# Patient Record
Sex: Male | Born: 1945 | Race: White | Hispanic: No | Marital: Single | Smoking: Never smoker
Health system: Southern US, Academic
[De-identification: ages and names within clinical notes are randomized; demographics above are authoritative.]

## PROBLEM LIST (undated history)

## (undated) DIAGNOSIS — D509 Iron deficiency anemia, unspecified: Secondary | ICD-10-CM

## (undated) DIAGNOSIS — E559 Vitamin D deficiency, unspecified: Secondary | ICD-10-CM

## (undated) DIAGNOSIS — I10 Essential (primary) hypertension: Secondary | ICD-10-CM

## (undated) DIAGNOSIS — N400 Enlarged prostate without lower urinary tract symptoms: Secondary | ICD-10-CM

## (undated) DIAGNOSIS — E782 Mixed hyperlipidemia: Secondary | ICD-10-CM

## (undated) DIAGNOSIS — E039 Hypothyroidism, unspecified: Secondary | ICD-10-CM

## (undated) DIAGNOSIS — M109 Gout, unspecified: Secondary | ICD-10-CM

## (undated) DIAGNOSIS — E538 Deficiency of other specified B group vitamins: Secondary | ICD-10-CM

## (undated) HISTORY — PX: TONSILLECTOMY: SUR1361

---

## 1999-08-12 ENCOUNTER — Other Ambulatory Visit (HOSPITAL_COMMUNITY): Payer: Self-pay

## 2013-08-21 IMAGING — CR XRAY CERVICAL SPINE COMPLETE
1 series · 7 of 7 positions shown · non-contrast
Comparison: None.

Exam:   

Cervical spine 4V
INDICATION: Neck pain.

[Series 1: view not recorded · oblique · 0.17mm/px · 7 of 7 slices shown]
[im 1/7]
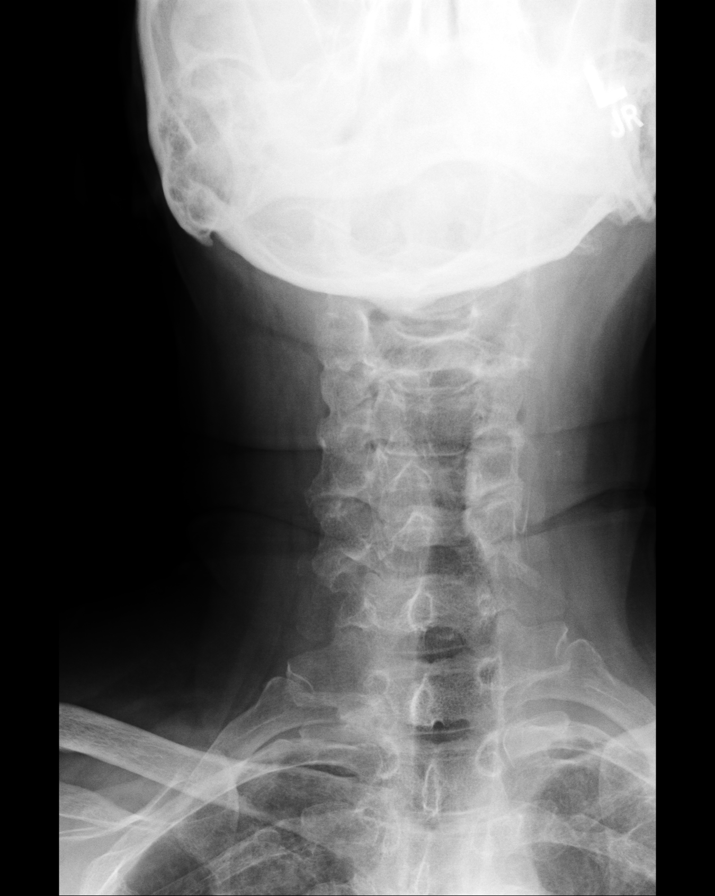
[im 2/7]
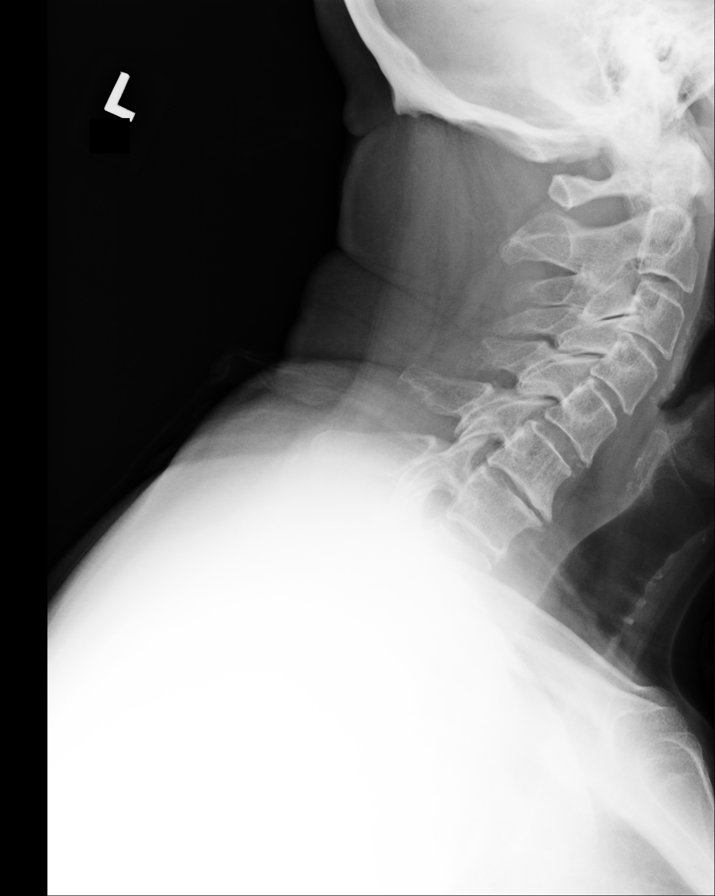
[im 3/7]
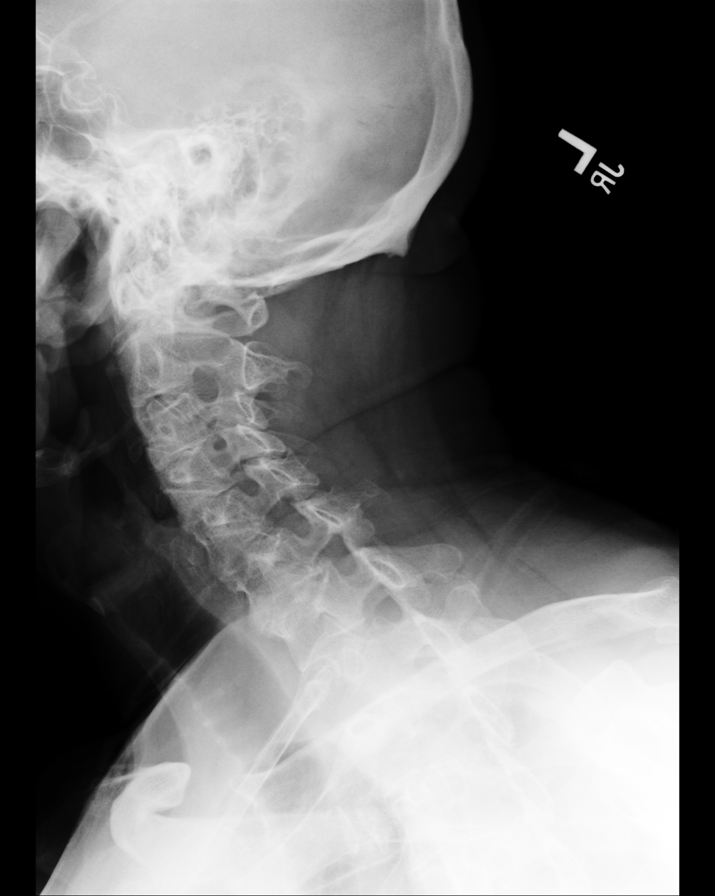
[im 4/7]
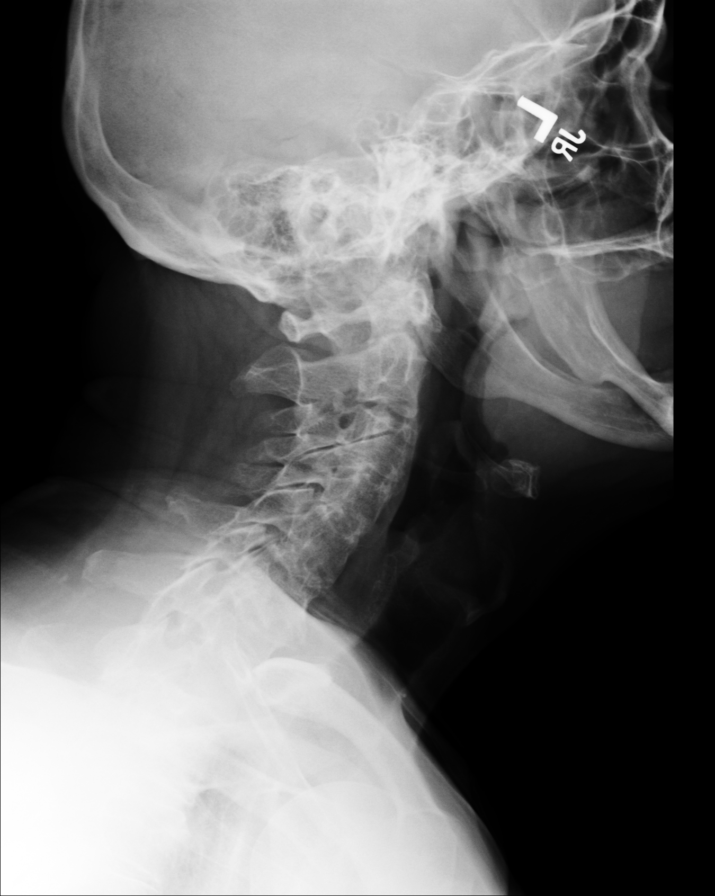
[im 5/7]
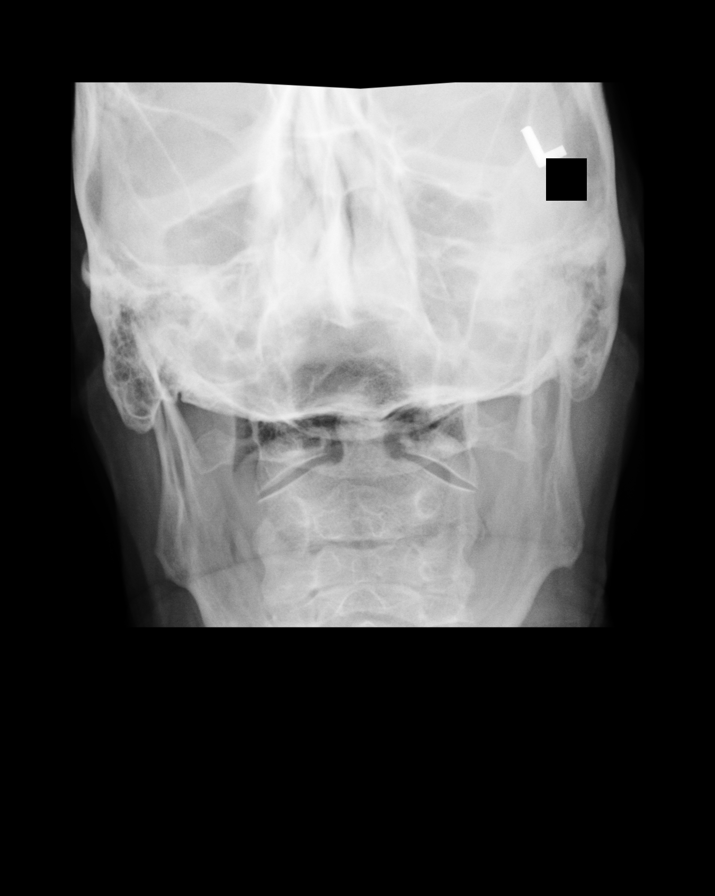
[im 6/7]
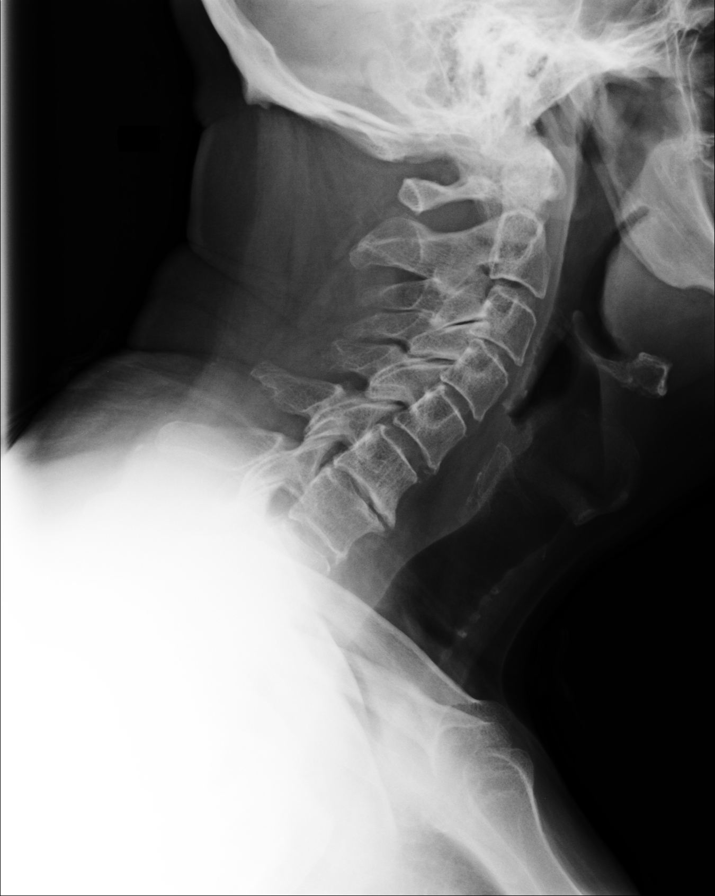
[im 7/7]
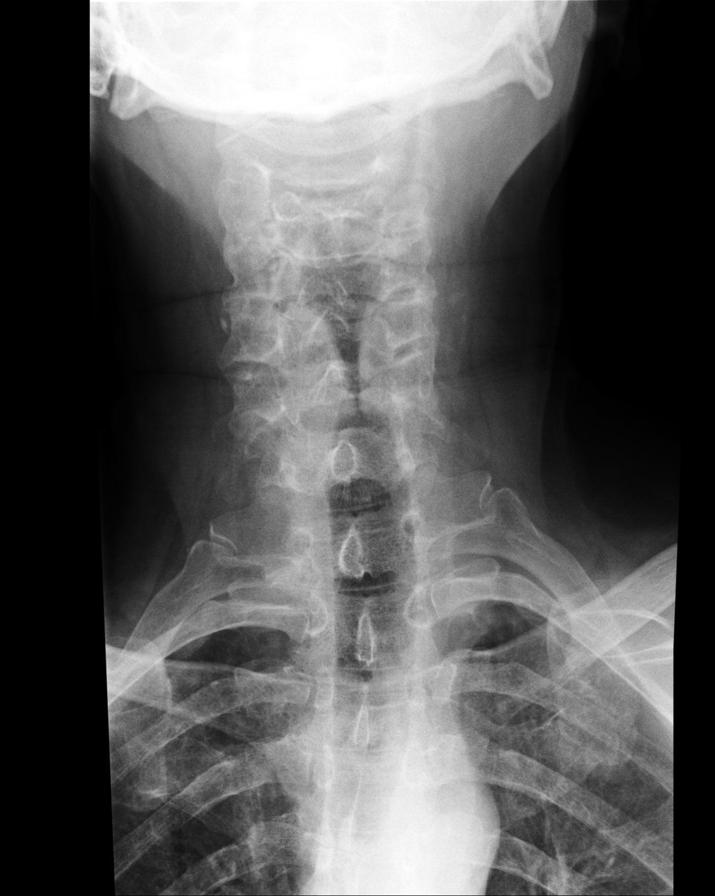

[7 of 7 positions shown; findings below may reference images not displayed]

FINDINGS: There is no evidence of an acute fracture or subluxation. Moderate to severe disc space narrowing is noted at C6-7 level. There is no prevertebral soft tissue swelling. Atlanto-axial articulation appears well maintained. There are minimal vascular calcifications.
IMPRESSION: 1.
 Arthritic changes as detailed above. Please consider further evaluation with MRI for persistent or worsening symptoms.

## 2016-06-08 IMAGING — CR XRAY KNEE [DATE] VIEWS RT
1 series · 3 of 3 positions shown · non-contrast
Comparison: None.

Exam:   

Right knee 3V
INDICATION: Pain.

[Series 1: view not recorded · 0.17mm/px · 3 of 3 slices shown]
[im 1/3]
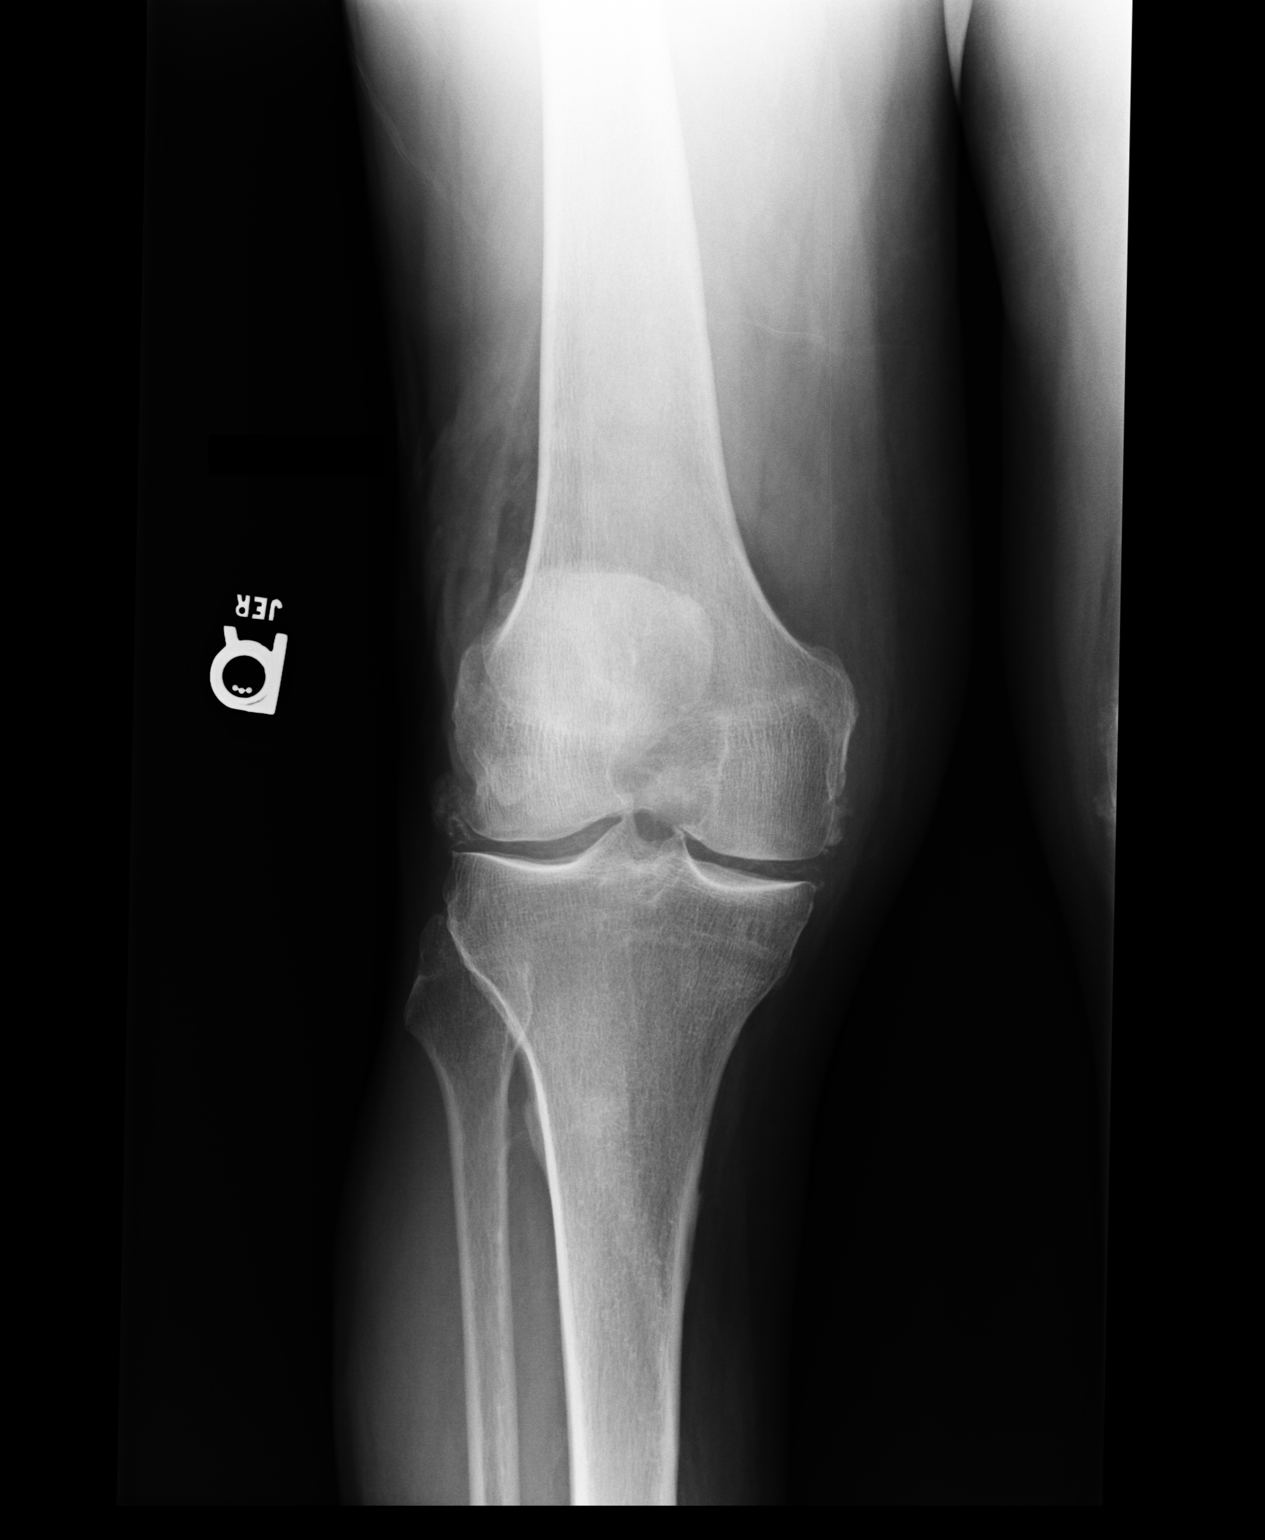
[im 2/3]
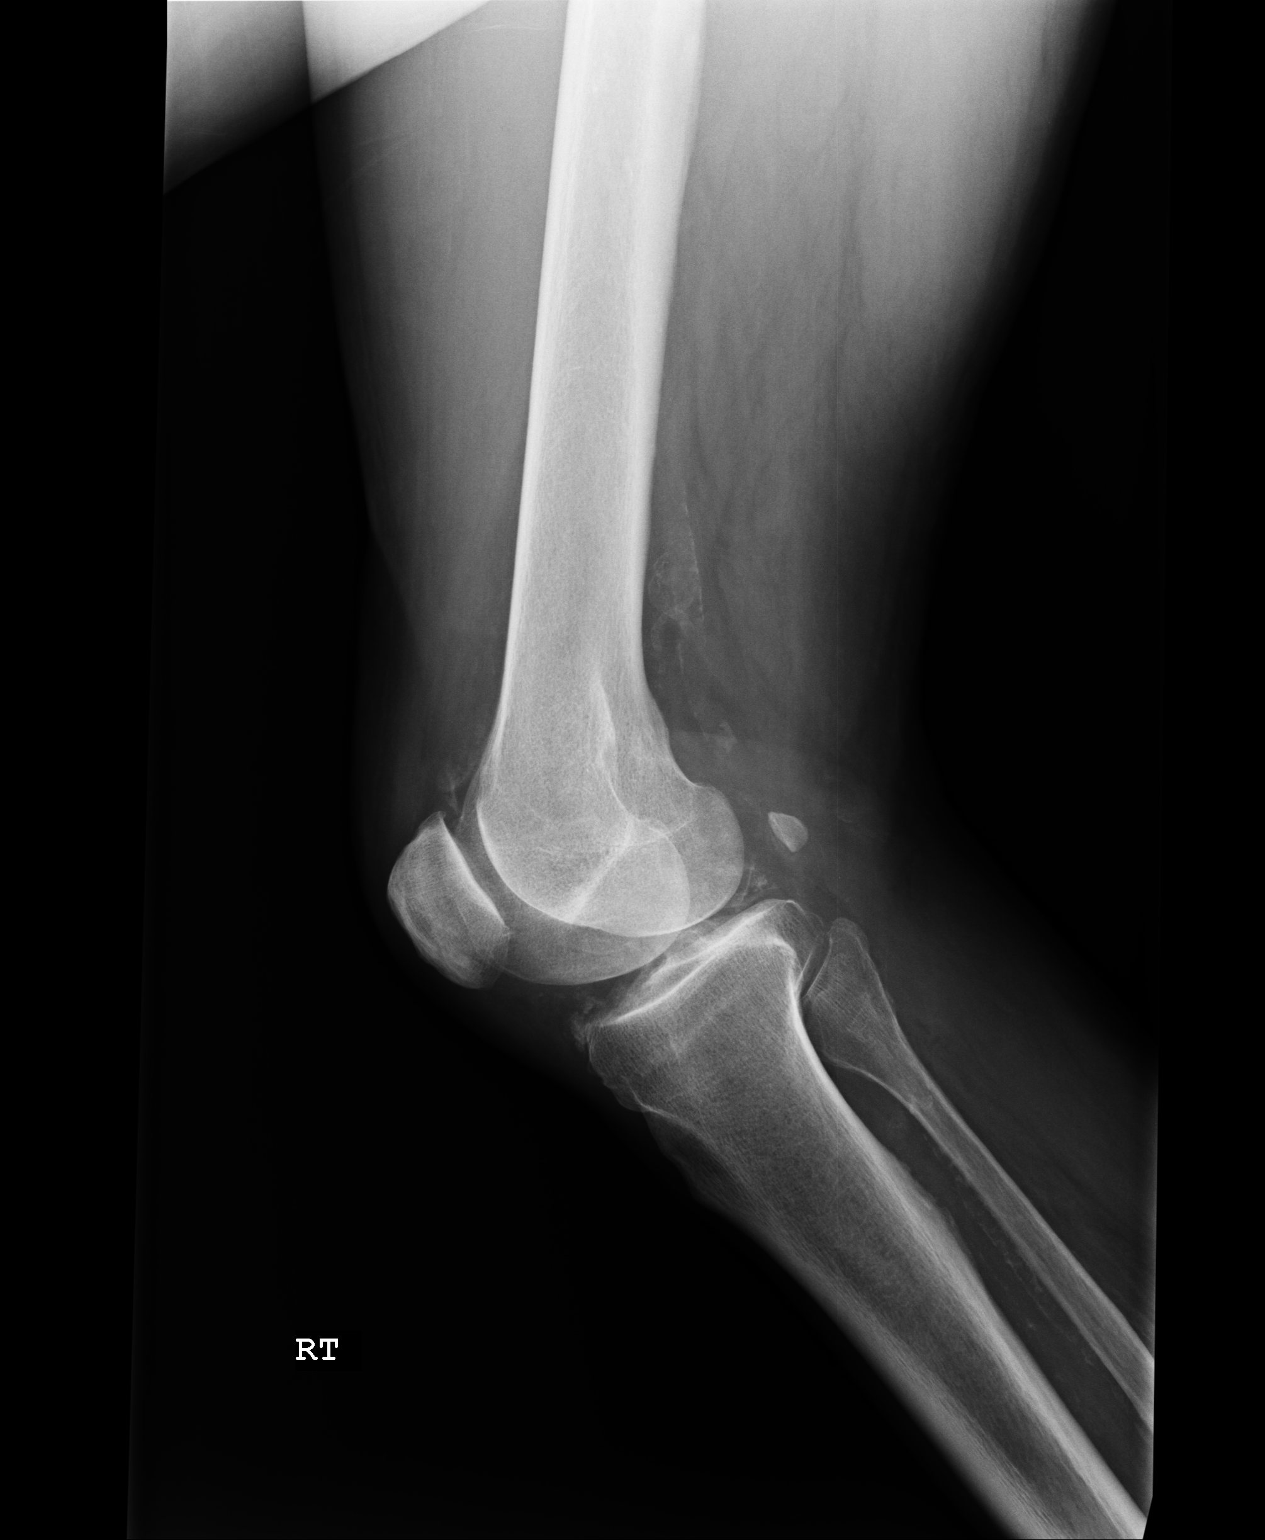
[im 3/3]
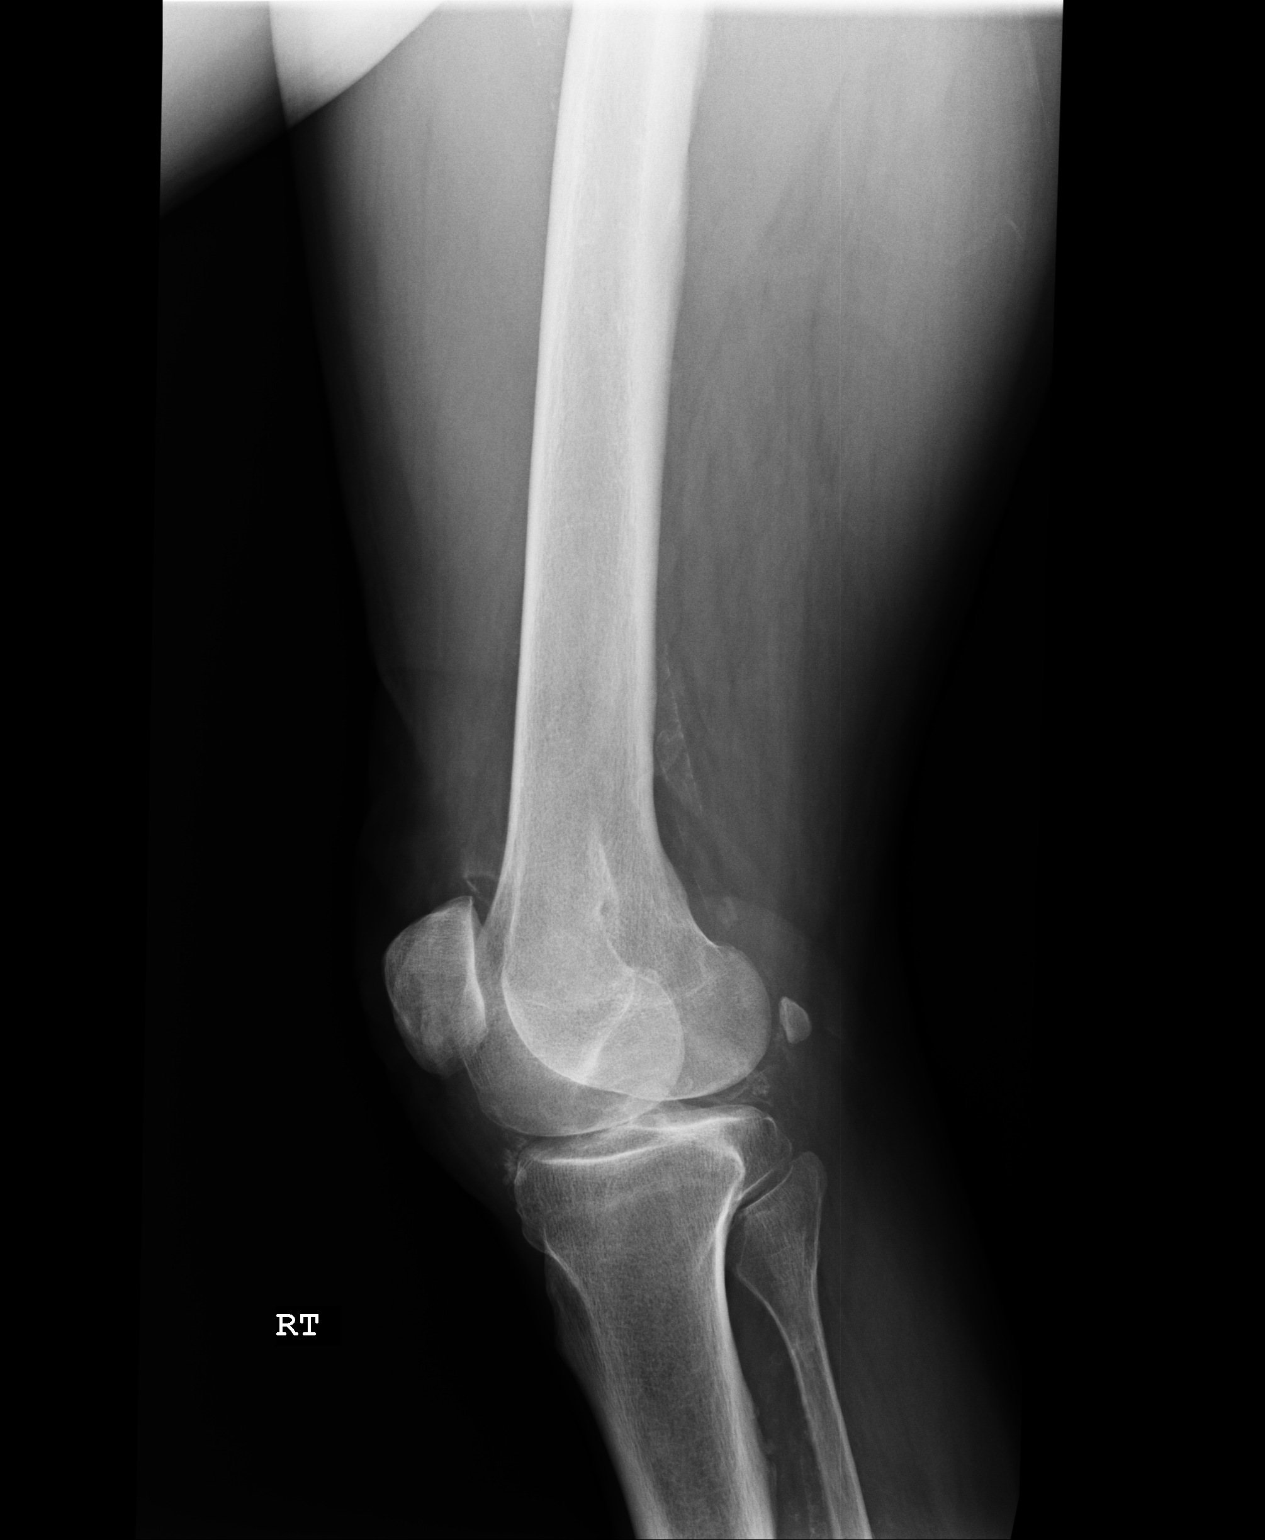

[3 of 3 positions shown; findings below may reference images not displayed]

FINDINGS: There is no acute fracture or subluxation. Mild tricompartmental joint space narrowing is seen. Meniscal calcifications are consistent with chondrocalcinosis. There are extensive vascular calcifications. There is no suprapatellar effusion.
IMPRESSION: Mild tricompartmental joint space narrowing with chondrocalcinosis. Please consider further evaluation with MRI for persistent or worsening symptoms.

## 2022-07-13 ENCOUNTER — Other Ambulatory Visit: Payer: Medicare Other

## 2022-07-13 ENCOUNTER — Other Ambulatory Visit: Payer: Self-pay

## 2022-07-13 DIAGNOSIS — G609 Hereditary and idiopathic neuropathy, unspecified: Secondary | ICD-10-CM | POA: Insufficient documentation

## 2022-07-13 DIAGNOSIS — R739 Hyperglycemia, unspecified: Secondary | ICD-10-CM | POA: Insufficient documentation

## 2022-07-13 LAB — VITAMIN B12: VITAMIN B 12: 608 pg/mL (ref 180–914)

## 2022-07-13 LAB — C-REACTIVE PROTEIN (CRP): C-REACTIVE PROTEIN (CRP): 3.1 mg/dL — ABNORMAL HIGH (ref 0.1–0.5)

## 2022-07-13 LAB — HGA1C (HEMOGLOBIN A1C WITH EST AVG GLUCOSE): HEMOGLOBIN A1C: 5.8 % (ref 4.0–6.0)

## 2022-07-13 LAB — FOLATE: FOLATE: >23.8 ng/mL (ref 5.9–24.4)

## 2022-07-15 LAB — MONOCLONAL GAMMOPATHY PROFILE WITH IMMUNOTYPING REFLEX
ALBUMIN: 3.9 g/dL (ref 3.4–4.8)
KAPPA FREE LIGHT CHAINS: 3.82 mg/dL — ABNORMAL HIGH (ref 0.33–1.94)
KAPPA/LAMBDA FLC RATIO: 1.85 — ABNORMAL HIGH (ref 0.26–1.65)
LAMBDA FREE LIGHT CHAINS: 2.07 mg/dL (ref 0.57–2.63)
TOTAL PROTEIN: 7.2 g/dL (ref 5.6–7.6)

## 2022-07-15 LAB — HEP-2 SUBSTRATE ANTINUCLEAR ANTIBODIES (ANA), SERUM: ANA INTERPRETATION: NEGATIVE

## 2022-07-15 LAB — LYME DISEASE ANTIBODY, IGM: LYME ANTIBODY IGM (Confirmation): NEGATIVE

## 2022-07-15 LAB — KAPPA AND LAMBDA FREE LIGHT CHAINS, SERUM
KAPPA FREE LIGHT CHAINS: 3.82 mg/dL — ABNORMAL HIGH (ref 0.33–1.94)
KAPPA/LAMBDA FLC RATIO: 1.85 — ABNORMAL HIGH (ref 0.26–1.65)
LAMBDA FREE LIGHT CHAINS: 2.07 mg/dL (ref 0.57–2.63)

## 2022-07-15 LAB — LYME DISEASE ANTIBODY, IGG: LYME ANTIBODY IGG (Confirmation): POSITIVE — AB

## 2022-07-15 LAB — LYME ANTIBODY PANEL WITH REFLEX: LYME ANTIBODY TOTAL (Screen): POSITIVE — AB

## 2022-07-15 LAB — SYPHILIS SCREENING ALGORITHM WITH REFLEX, SERUM: SYPHILIS TP ANTIBODIES: NONREACTIVE

## 2022-07-15 LAB — ALBUMIN FOR ELECTROPHORESIS: ALBUMIN: 3.9 g/dL (ref 3.4–4.8)

## 2022-07-15 LAB — PROTEIN FOR ELECTROPHORESIS: PROTEIN TOTAL: 7.2 g/dL (ref 5.6–7.6)

## 2022-07-19 LAB — ANGIOTENSIN CONVERTING ENZYME (ACE), SERUM: ANGIOTENSIN CONV ENZYME: 5.4 U/L — ABNORMAL LOW (ref 9–67)

## 2022-07-20 LAB — CRYOGLOBULIN,SERUM

## 2022-07-28 ENCOUNTER — Other Ambulatory Visit (HOSPITAL_COMMUNITY): Payer: Self-pay

## 2022-07-28 DIAGNOSIS — G619 Inflammatory polyneuropathy, unspecified: Secondary | ICD-10-CM

## 2022-07-28 LAB — QUEST MISC ORDER - REFRIGERATED

## 2022-07-29 IMAGING — MR MRI THORACIC SPINE WITHOUT CONTRAST
4 of 7 series · 25 of 48 positions shown · IV contrast (gadolinium)
Comparison: None available.

﻿EXAM:  [id number]   MRI THORACIC SPINE WITHOUT CONTRAST
TECHNIQUE: Multiplanar, multisequential MRI of the thoracic spine was performed without gadolinium contrast.

[Series 5: T2 · sagittal · 4.0mm · 0.78mm/px · 8 of 13 slices shown (1 of 3)]
[im 1/13]
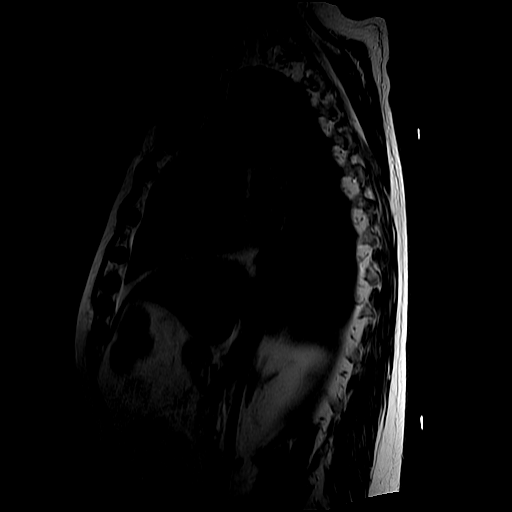
[im 2/13]
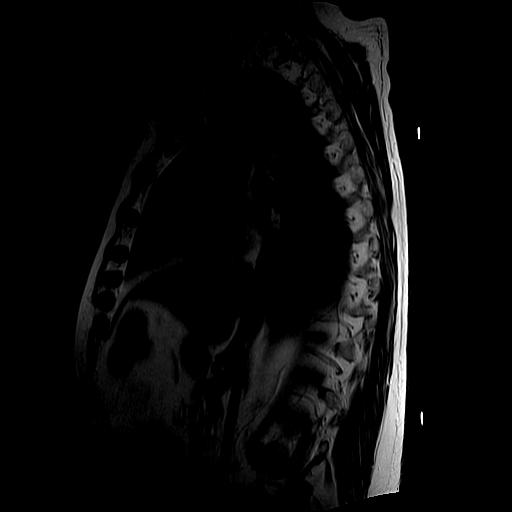
[im 5/13]
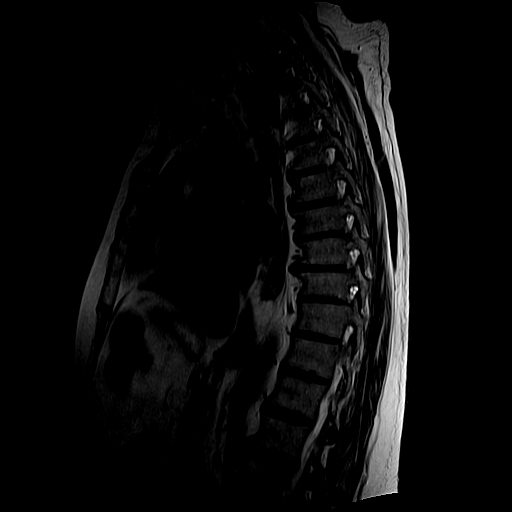
[im 6/13]
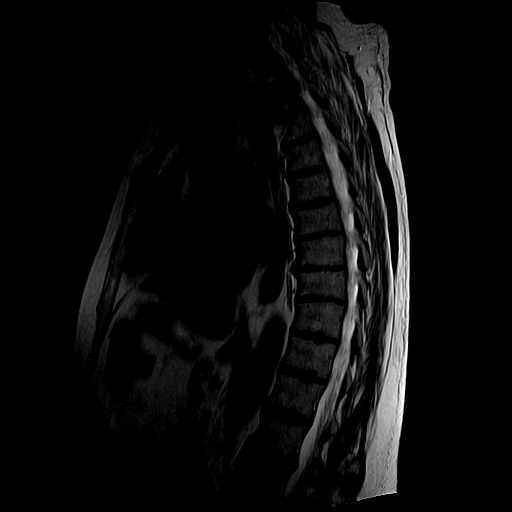
[im 7/13]
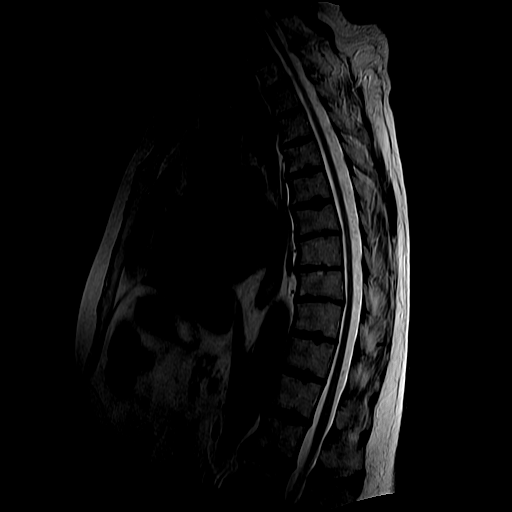
[im 9/13]
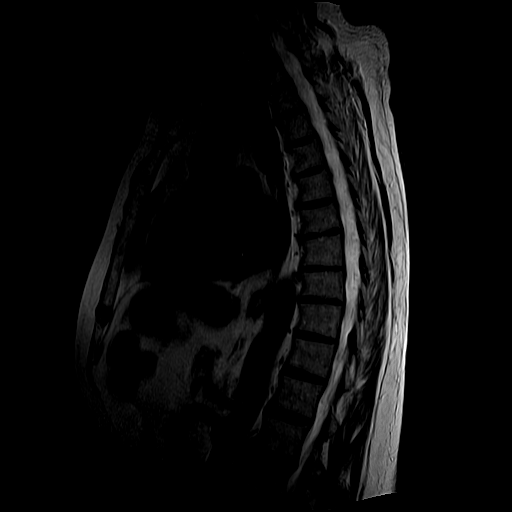
[im 11/13]
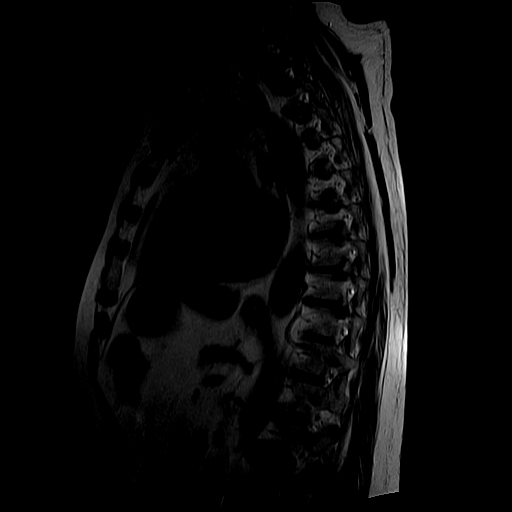
[im 13/13]
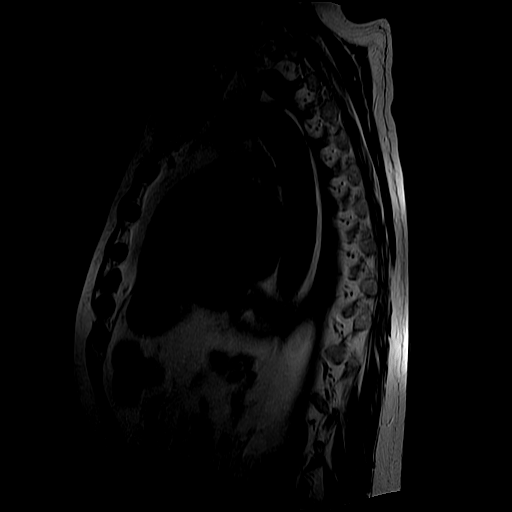

[Series 6: T1 · sagittal · 4.0mm · 0.78mm/px · 7 of 13 slices shown]
[im 1/13]
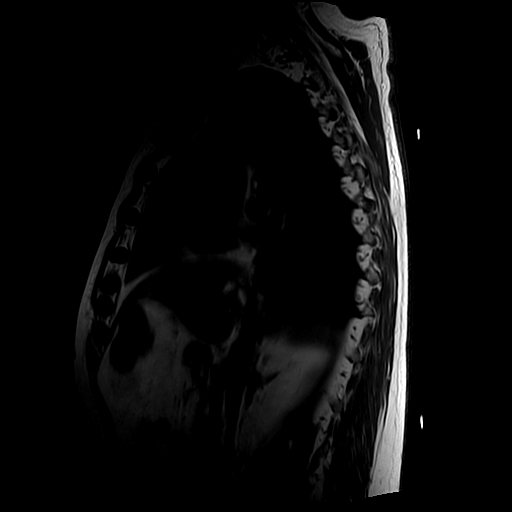
[im 2/13]
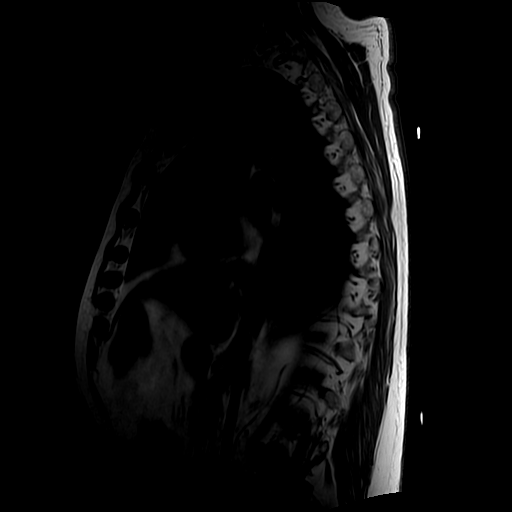
[im 4/13]
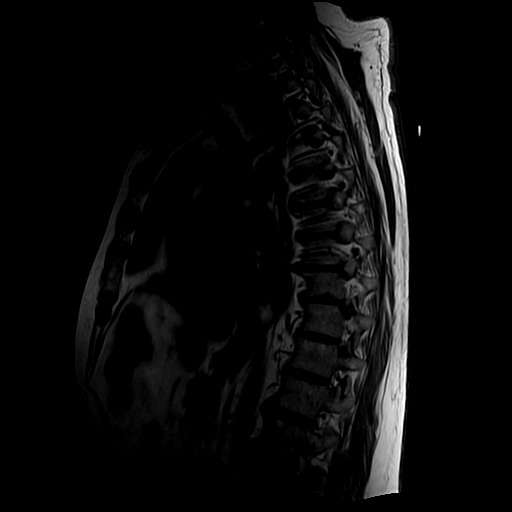
[im 5/13]
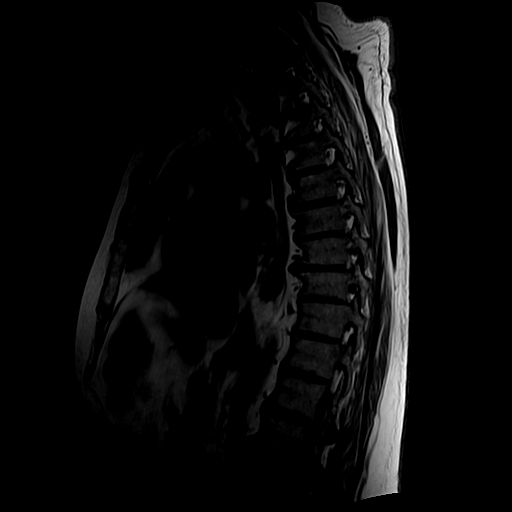
[im 7/13]
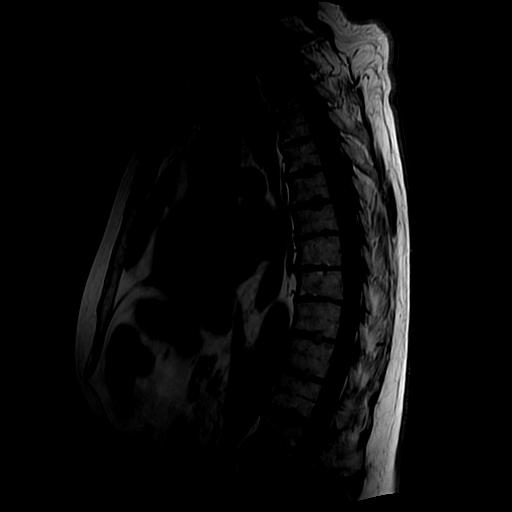
[im 8/13]
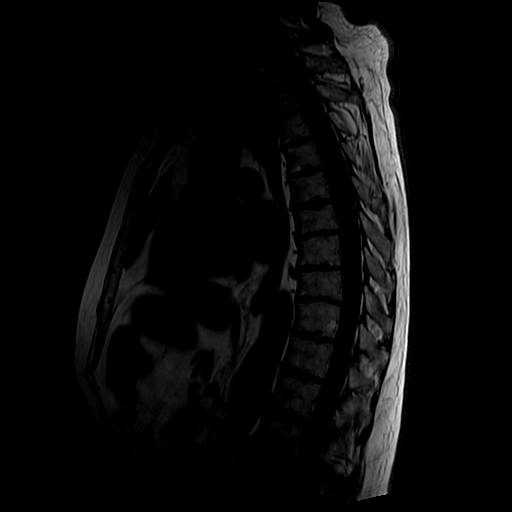
[im 11/13]
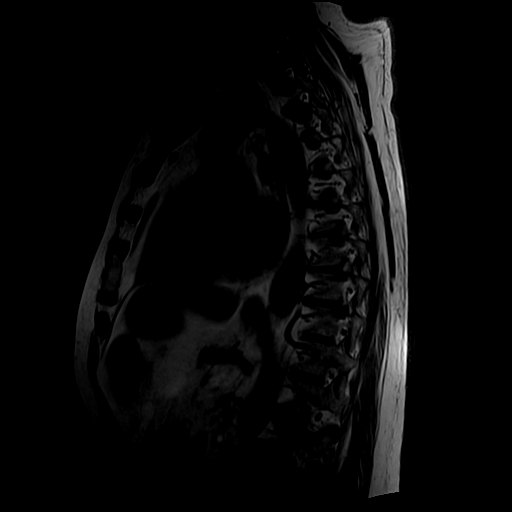

[Series 8: T2 · axial · 4.0mm · 0.62mm/px · z∈[-323,-90]mm · 9 of 12 slices shown (2 of 3)]
[im 1/12]
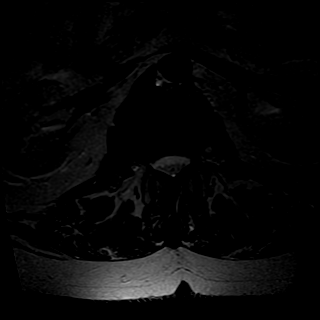
[im 2/12]
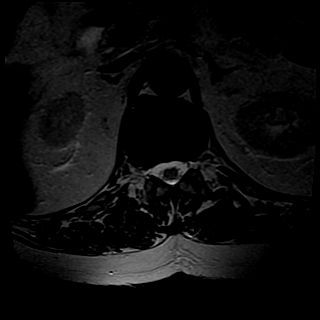
[im 3/12]
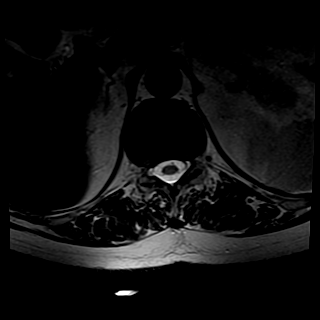
[im 5/12]
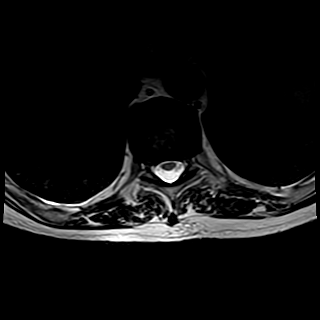
[im 6/12]
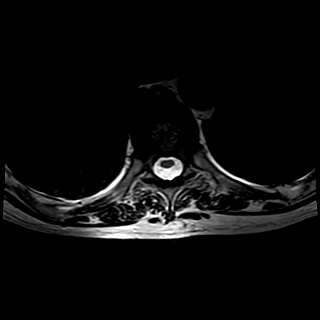
[im 7/12]
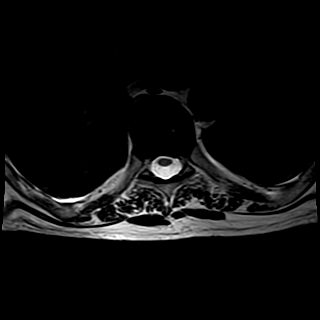
[im 9/12]
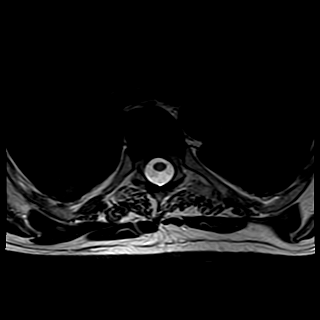
[im 10/12]
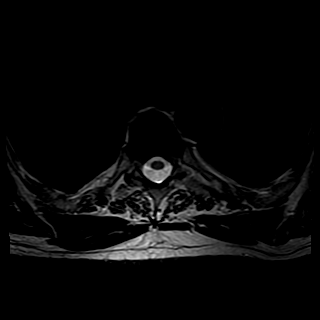
[im 12/12]
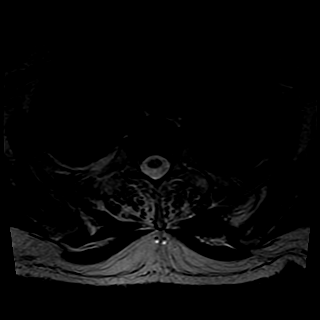

[Series 10: T2 · axial · 4.0mm · 0.62mm/px · 1 of 2 slices shown (3 of 3)]
[im 1/2]
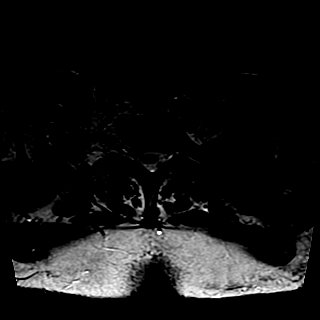

[25 of 48 positions shown; findings below may reference images not displayed]

FINDINGS: No acute bony lesions of the thoracic vertebrae are seen.  

Moderate degenerative disc changes are seen in the mid and lower thoracic spine at multiple levels with prominent anterior osteophyte formation in the lower thoracic region.  However, there is no evidence of central spinal stenosis or foraminal stenosis in the thoracic region.  

Thoracic spinal cord shows no focal lesions.  Paravertebral soft tissues are unremarkable.
IMPRESSION: 1. No focal bone changes of thoracic vertebrae.  

2. No evidence of central spinal stenosis or foraminal stenosis in the thoracic region.  Degenerative disc changes with prominent anterior osteophyte formation are noted in the mid and lower thoracic spine.  

3. No focal lesions of thoracic spinal cord are seen.

## 2022-07-29 IMAGING — MR MRI LUMBAR SPINE WITHOUT CONTRAST
5 of 6 series · 38 of 48 positions shown · IV contrast (gadolinium)
Comparison: None available.

﻿EXAM:  [id number]   MRI LUMBAR SPINE WITHOUT CONTRAST
INDICATION: 76-year-old with low back pain and bilateral lower extremity weakness.  No history of malignancy of back surgery.
TECHNIQUE: Multiplanar, multisequential MRI of the lumbosacral spine was performed without gadolinium contrast.

[Series 6: T2 · sagittal · 4.0mm · 0.94mm/px · 5 of 13 slices shown (1 of 3)]
[im 1/13]
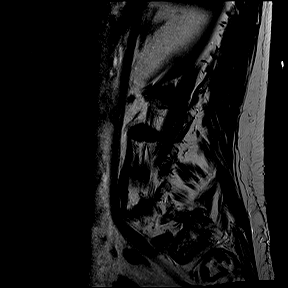
[im 4/13]
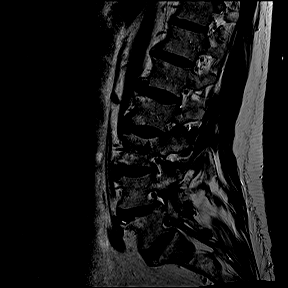
[im 7/13]
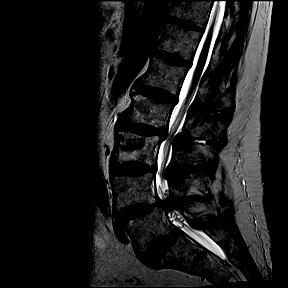
[im 10/13]
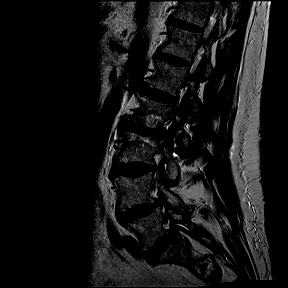
[im 13/13]
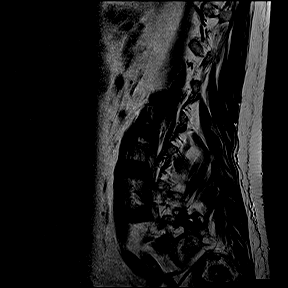

[Series 7: T1 · sagittal · 4.0mm · 0.94mm/px · 6 of 13 slices shown (1 of 2)]
[im 1/13]
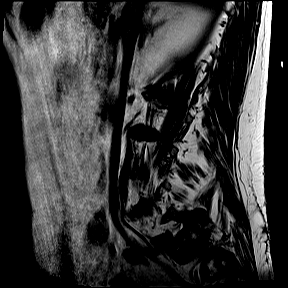
[im 3/13]
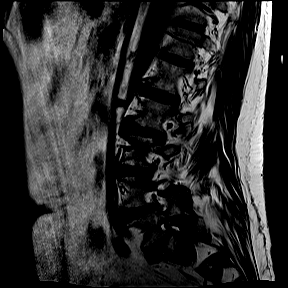
[im 5/13]
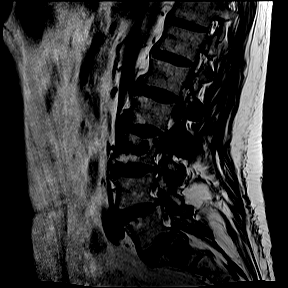
[im 8/13]
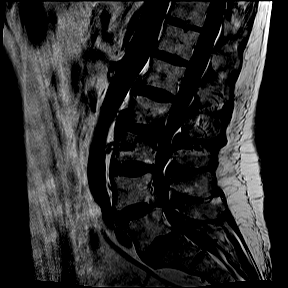
[im 10/13]
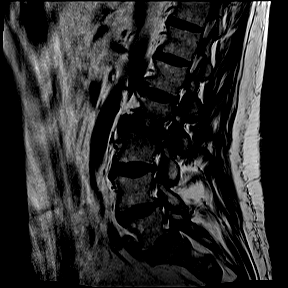
[im 13/13]
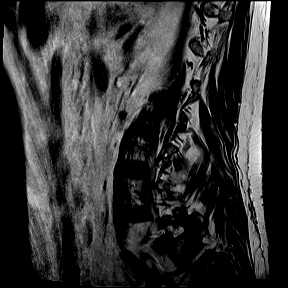

[Series 9: T2 · axial · 4.0mm · 0.47mm/px · z∈[-175,+41]mm · 11 of 23 slices shown (2 of 3)]
[im 1/23]
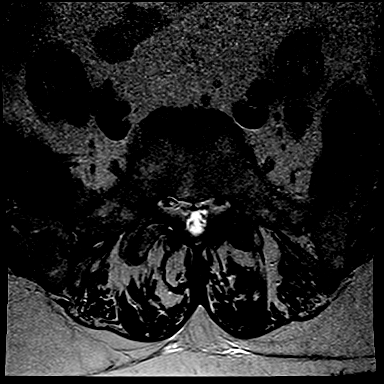
[im 3/23]
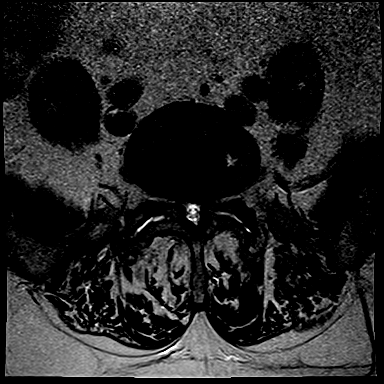
[im 5/23]
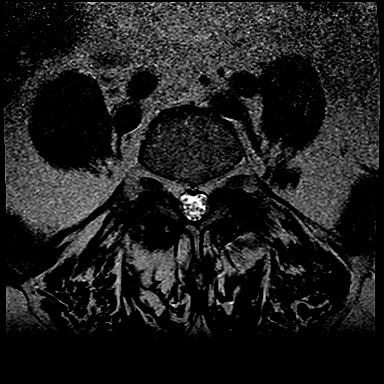
[im 7/23]
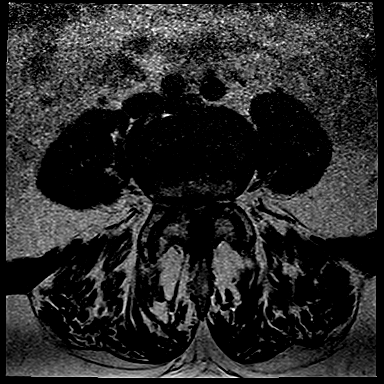
[im 9/23]
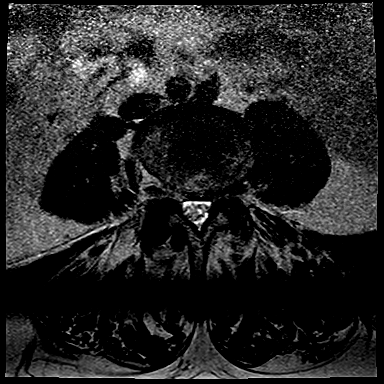
[im 12/23]
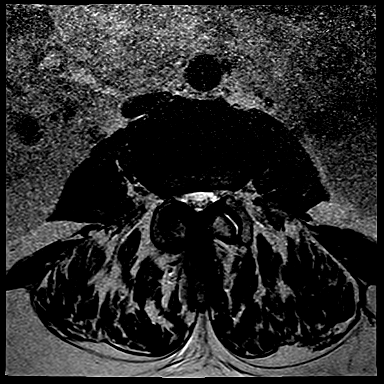
[im 14/23]
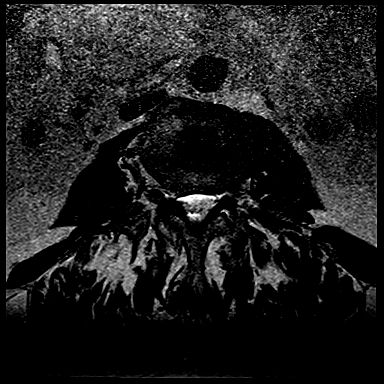
[im 16/23]
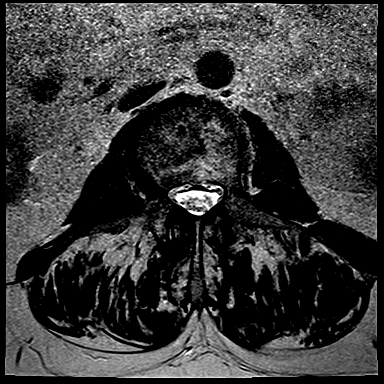
[im 18/23]
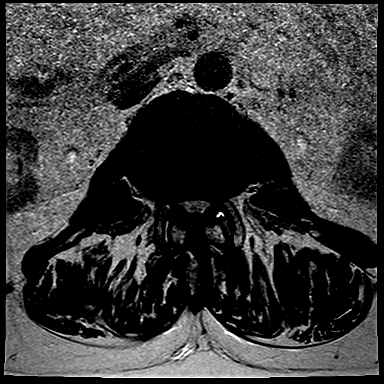
[im 20/23]
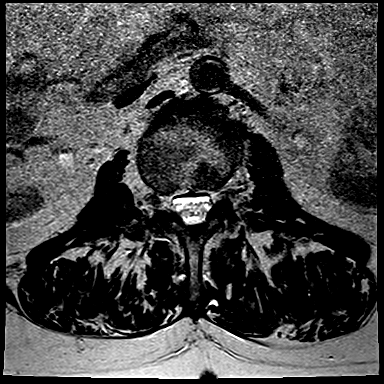
[im 23/23]
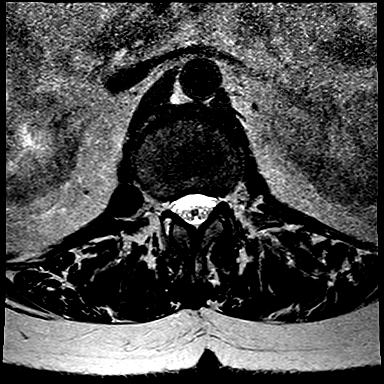

[Series 10: T1 · axial · 4.0mm · 0.47mm/px · z∈[-175,-5]mm · 7 of 23 slices shown (2 of 2)]
[im 1/23]
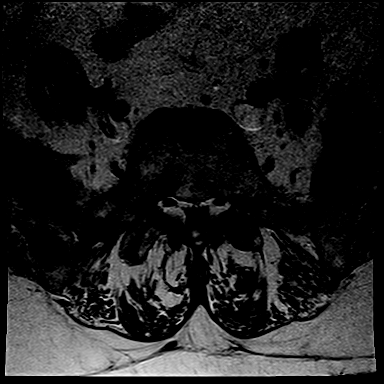
[im 5/23]
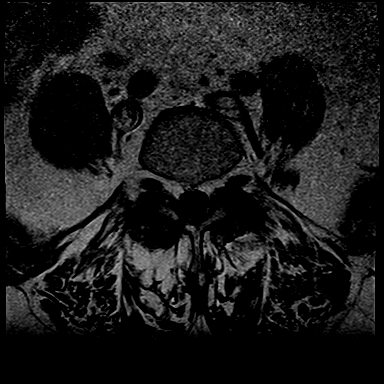
[im 7/23]
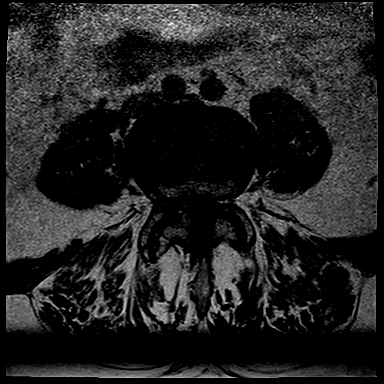
[im 9/23]
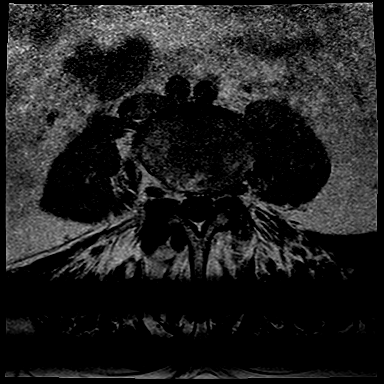
[im 14/23]
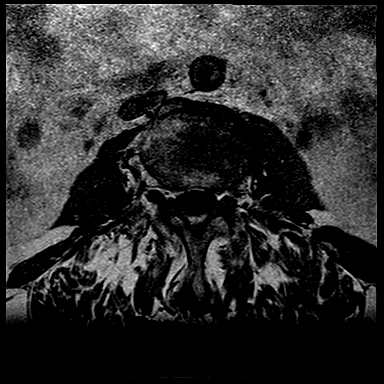
[im 16/23]
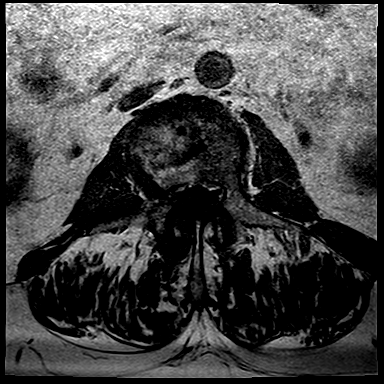
[im 18/23]
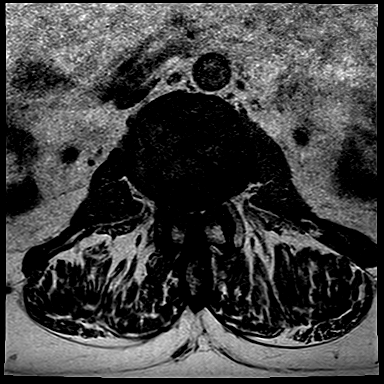

[Series 11: T2 · coronal · 4.0mm · 0.90mm/px · 9 of 20 slices shown (3 of 3)]
[im 1/20]
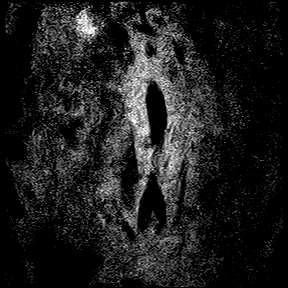
[im 3/20]
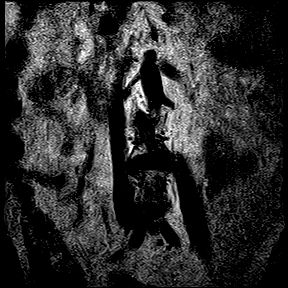
[im 5/20]
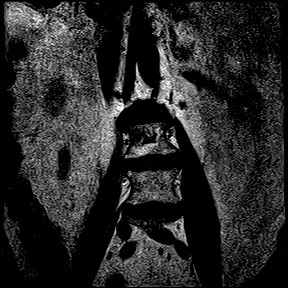
[im 8/20]
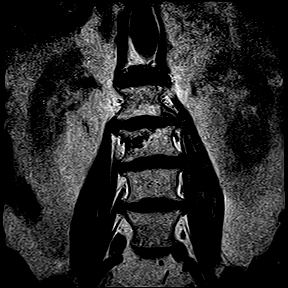
[im 10/20]
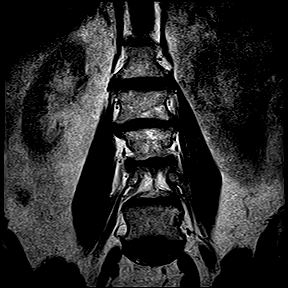
[im 12/20]
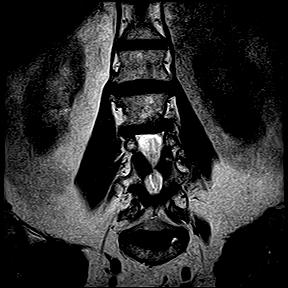
[im 15/20]
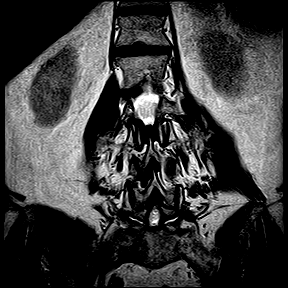
[im 17/20]
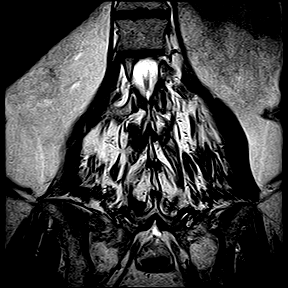
[im 20/20]
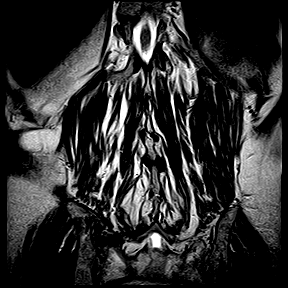

[38 of 48 positions shown; findings below may reference images not displayed]

FINDINGS: The lower spinal cord and cauda equina do not show any focal lesions.  

At L1-2 disc level, degenerative disc disease with bulging annulus and facet arthropathy are causing moderate compromise of both lateral recesses and neural foramina.   

At L2-3 level, severe degenerative changes and significant Modic inflammatory changes on both sides of the L2-3 disc are noted.  Degenerative disc disease and severe bilateral facet arthropathy with hypertrophy of dorsal ligaments are causing severe compromise of both lateral recesses and neural foramina and significant compromise of the thecal sac with the AP diameter in the midline measuring 6.4 mm.  

At L3-4 level, significant bilateral facet arthropathy and degenerative disc changes with facet arthropathy with hypertrophy of dorsal ligaments is causing severe compromise of both lateral recesses and neural foramina, right worse than the left.  Moderate compromise of the thecal sac with the AP diameter in the midline measuring 9 mm. 

At L4-5 level, severe bilateral facet arthropathy is noted with hypertrophy of ligaments and minimal degenerative listhesis.  Facet changes and severe degenerative disc disease are causing advanced degree of spinal stenosis and severe compromise of both lateral recesses and neural foramina.  The AP diameter of the thecal sac in the midline measures less than 3 mm.  

At L5-S1 level, severe facet arthropathy with minimal degenerative spondylolisthesis is noted.  Significant compromise of both lateral recesses and neural foramina are noted at the L5-S1 level.  

Paravertebral soft tissues are unremarkable.
IMPRESSION: 1. At L4-5 level, severe bilateral facet arthropathy is noted with hypertrophy of ligaments and minimal degenerative listhesis.  Facet changes and severe degenerative disc disease are causing advanced degree of spinal stenosis and severe compromise of both lateral recesses and neural foramina.  The AP diameter of the thecal sac in the midline measures less than 3 mm.  

2. At L3-4 level, significant bilateral facet arthropathy and degenerative disc changes with facet arthropathy with hypertrophy of dorsal ligaments is causing severe compromise of both lateral recesses and neural foramina, right worse than the left.  Moderate compromise of the thecal sac with the AP diameter in the midline measuring 9 mm. 

3. At L2-3 level, severe degenerative changes and significant Modic inflammatory changes on both sides of the L2-3 disc are noted.  Degenerative disc disease and severe bilateral facet arthropathy with hypertrophy of dorsal ligaments are causing severe compromise of both lateral recesses and neural foramina and significant compromise of the thecal sac with the AP diameter in the midline measuring 6.4 mm.  

4. At L5-S1 level, severe facet arthropathy with minimal degenerative spondylolisthesis is noted.  Significant compromise of both lateral recesses and neural foramina are noted at the L5-S1 level.

## 2022-07-29 IMAGING — MR MRI CERVICAL SPINE WITHOUT CONTRAST
4 of 5 series · 22 of 48 positions shown · IV contrast (gadolinium)
Comparison: C-spine x-ray dated 08/21/2013.

﻿EXAM:  [id number]   MRI CERVICAL SPINE WITHOUT CONTRAST
INDICATION: Neck pain. Bilateral upper extremity weakness.
TECHNIQUE: Multiplanar, multisequential MRI of the C-spine was performed without gadolinium contrast.  The anterior coil could not be used due to the curvature of the spine. This limits the quality of the study. Overall quality is acceptable for interpretation.

[Series 8: T2 · sagittal · 3.0mm · 0.78mm/px · 7 of 11 slices shown (1 of 2)]
[im 1/11]
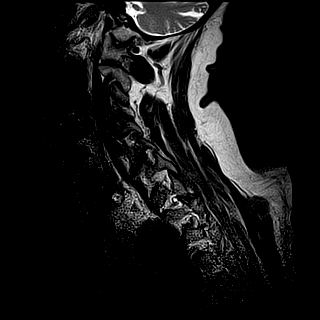
[im 2/11]
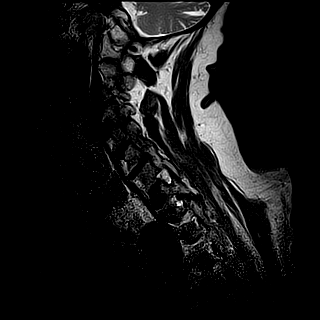
[im 4/11]
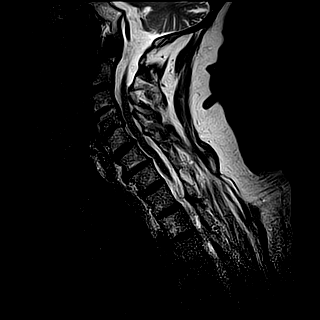
[im 6/11]
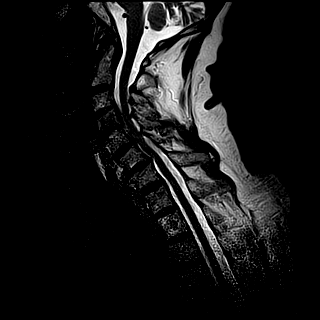
[im 7/11]
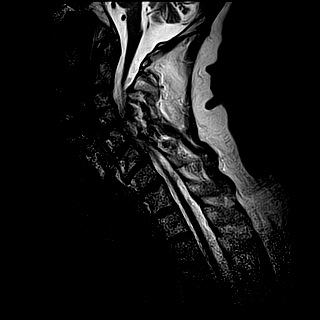
[im 9/11]
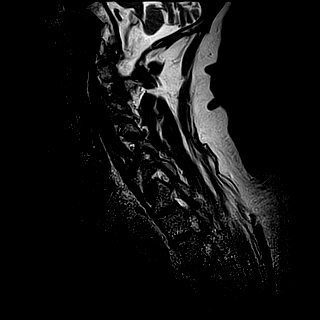
[im 11/11]
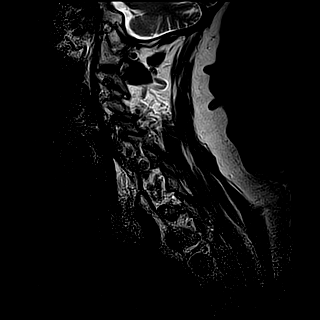

[Series 9: T1 · sagittal · 3.0mm · 0.49mm/px · 3 of 11 slices shown]
[im 2/11]
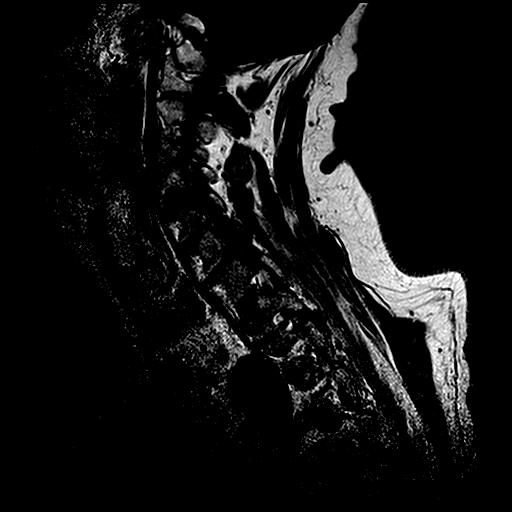
[im 6/11]
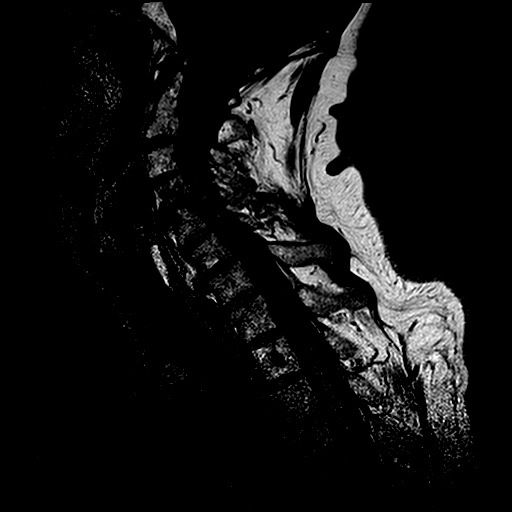
[im 9/11]
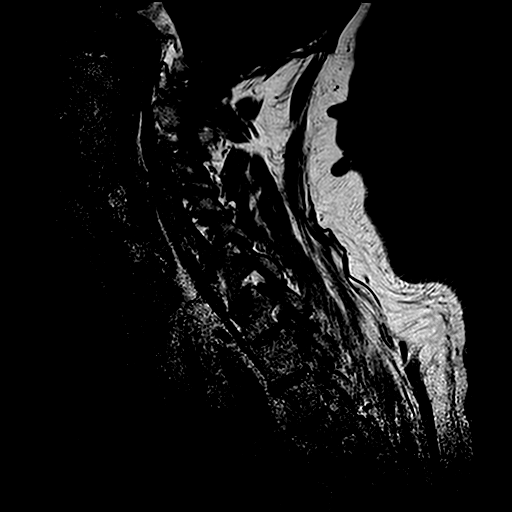

[Series 10: STIR · sagittal · 3.0mm · 0.49mm/px · 3 of 11 slices shown]
[im 2/11]
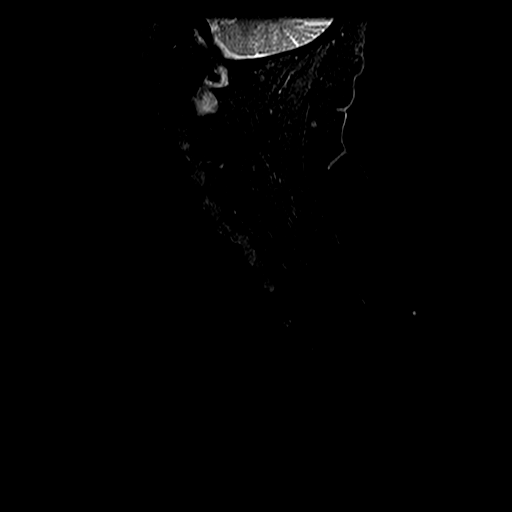
[im 6/11]
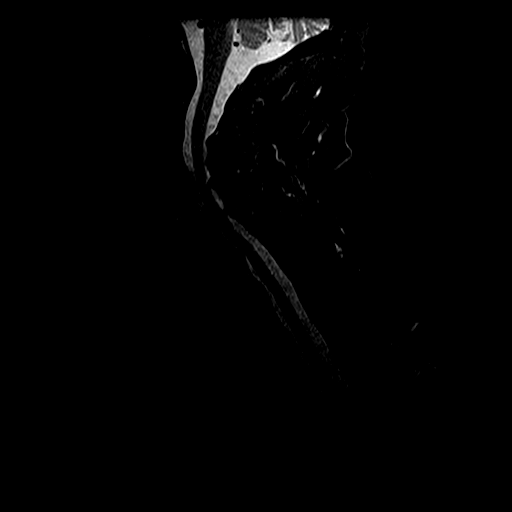
[im 9/11]
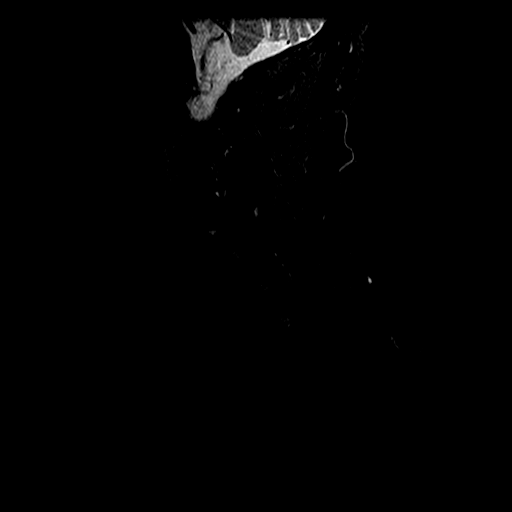

[Series 13: T2 · axial · 3.5mm · 0.31mm/px · z∈[-65,+48]mm · 9 of 18 slices shown (2 of 2)]
[im 1/18]
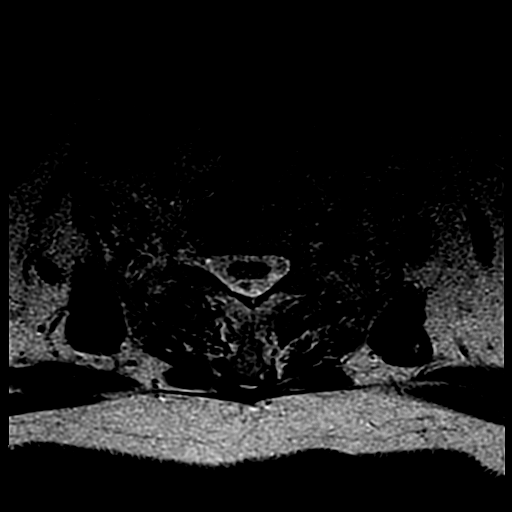
[im 3/18]
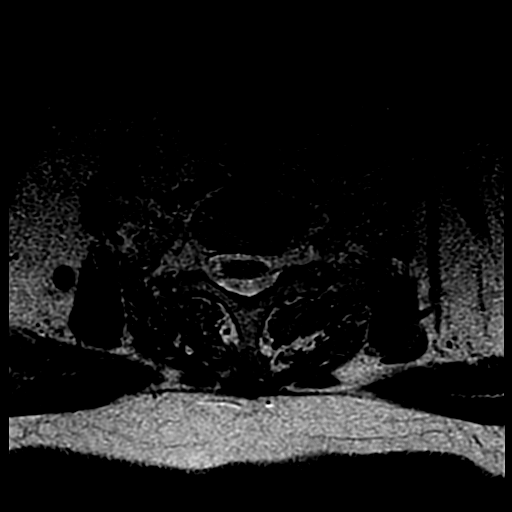
[im 6/18]
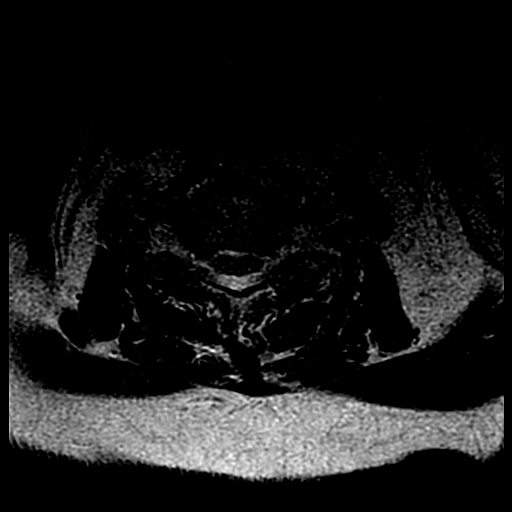
[im 8/18]
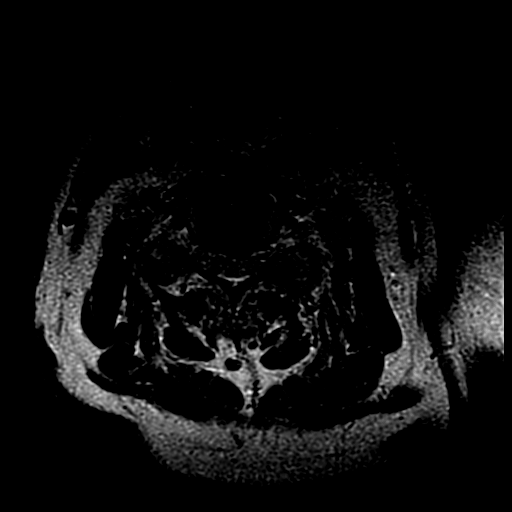
[im 9/18]
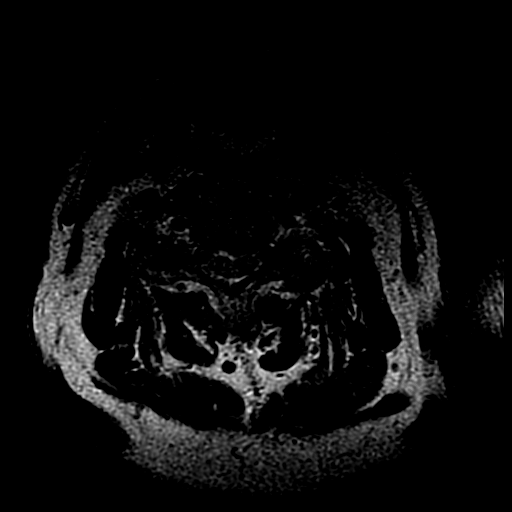
[im 10/18]
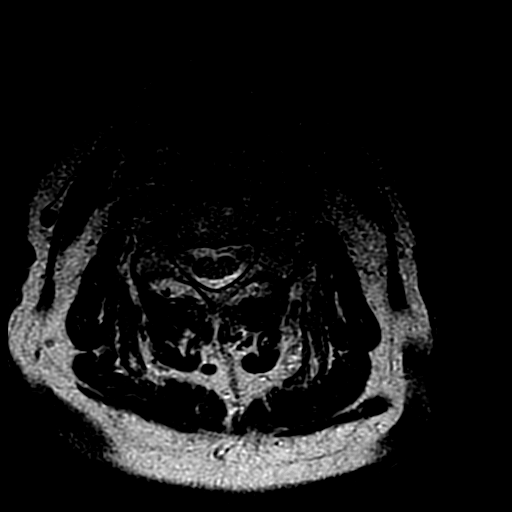
[im 12/18]
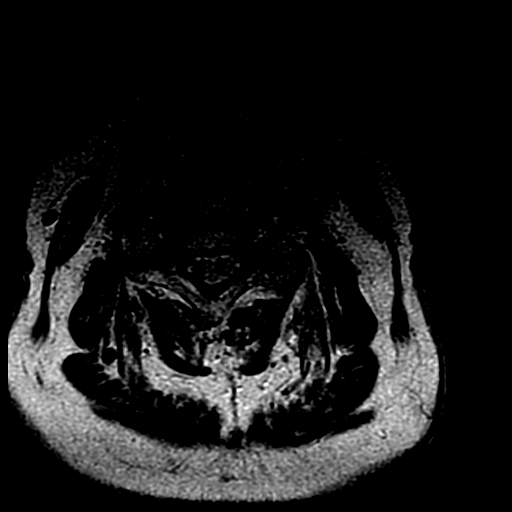
[im 15/18]
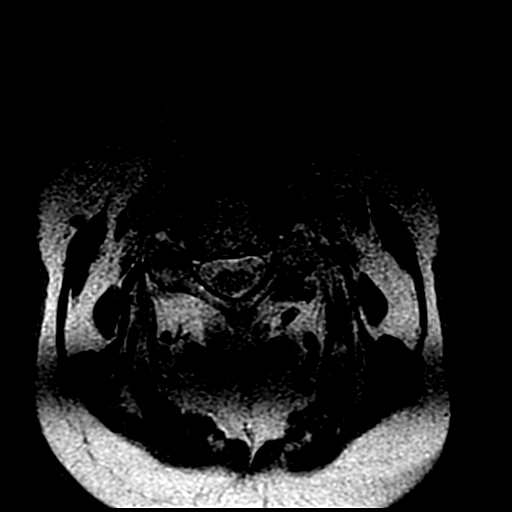
[im 18/18]
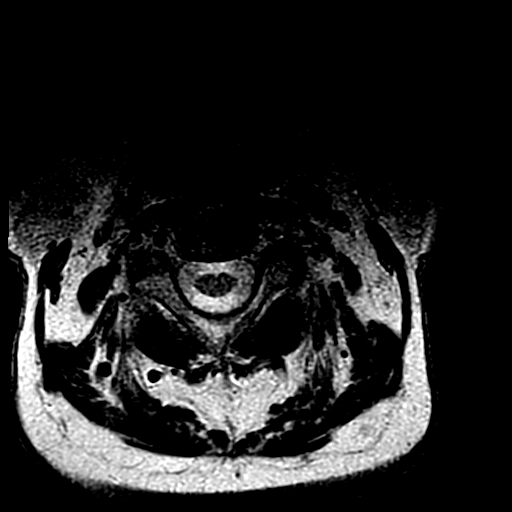

[22 of 48 positions shown; findings below may reference images not displayed]

FINDINGS: Modic Type 1 inflammatory changes on both sides of the C4-5 disc are noted.  

The posterior fossa and foramen magnum structures are normal in the sagittal projection.  

At C2-3 level, no focal disc lesions are seen. 

At C3-4 level, degenerative disc changes with osteophyte complex are causing moderately significant biforaminal narrowing, right more than the left. 

At C4-5 level, significant degenerative disc disease is noted with Modic inflammatory changes.  Disc/osteophyte complex is noted causing severe biforaminal narrowing, right worse than the left.  The AP diameter of the thecal sac in the midline measures 8.3 mm.  

At C5-6 level, bulging annulus due to degenerative disc disease is causing moderate compromise of the thecal sac and both lateral recesses.  The AP diameter of the thecal sac in the midline measures 7.4 mm.  

At C6-7 level, degenerative disc disease with prominent osteophyte complex is causing moderately significant compromise of the right lateral recess and moderate compromise of the thecal sac with the AP diameter in the midline measuring 6.3 mm.  

C7-T1 disc shows no focal lesions.

The cervical spinal cord shows no focal lesions.  Paravertebral soft tissues are unremarkable.
IMPRESSION: 1. Modic Type 1 inflammatory changes on both sides of C4-C5 disc due to degenerative changes noted. 

2. At C4-5 level, significant degenerative disc disease is noted with Modic inflammatory changes.  Disc/osteophyte complex is noted causing severe biforaminal narrowing, right worse than the left.  The AP diameter of the thecal sac in the midline measures 8.3 mm.  

3. At C5-6 level, bulging annulus due to degenerative disc disease is causing moderate compromise of the thecal sac and both lateral recesses.  The AP diameter of the thecal sac in the midline measures 7.4 mm.  

4. Findings at other disc levels are described above in detail.  

5. No focal lesions of cervical spinal cord are seen.

## 2022-08-03 ENCOUNTER — Encounter (HOSPITAL_COMMUNITY): Payer: Self-pay

## 2022-08-03 ENCOUNTER — Other Ambulatory Visit: Payer: Self-pay

## 2022-08-03 ENCOUNTER — Inpatient Hospital Stay
Admission: RE | Admit: 2022-08-03 | Discharge: 2022-08-03 | Disposition: A | Discharge status: Home / Self Care | Payer: Medicare Other | Source: Ambulatory Visit

## 2022-08-03 DIAGNOSIS — G619 Inflammatory polyneuropathy, unspecified: Secondary | ICD-10-CM | POA: Insufficient documentation

## 2022-08-03 HISTORY — DX: Iron deficiency anemia, unspecified: D50.9

## 2022-08-03 HISTORY — DX: Deficiency of other specified B group vitamins: E53.8

## 2022-08-03 HISTORY — DX: Gout, unspecified: M10.9

## 2022-08-03 HISTORY — DX: Essential (primary) hypertension: I10

## 2022-08-03 HISTORY — DX: Benign prostatic hyperplasia without lower urinary tract symptoms: N40.0

## 2022-08-03 HISTORY — DX: Hypothyroidism, unspecified: E03.9

## 2022-08-03 HISTORY — DX: Mixed hyperlipidemia: E78.2

## 2022-08-03 HISTORY — DX: Vitamin D deficiency, unspecified: E55.9

## 2022-08-03 LAB — BODY FLUID CELL COUNT WITH DIFFERENTIAL
NUCLEATED CELLS, FLUID: 0 /uL (ref <=5)
RBC COUNT: 467 /uL

## 2022-08-03 LAB — INDIA INK FOR CRYPTOCOCCUS: INDIA INK: NEGATIVE

## 2022-08-03 LAB — PROTEIN CSF: PROTEIN CSF: 79 mg/dL — ABNORMAL HIGH (ref 15–45)

## 2022-08-03 LAB — PLATELET FUNCTION COLLAGEN: PFA COLLAGEN/EPI RESULT: 122 seconds (ref 84–152)

## 2022-08-03 LAB — GLUCOSE CSF: GLUCOSE CSF: 65 mg/dL (ref 40–70)

## 2022-08-03 MED ORDER — MIDAZOLAM 5 MG/ML INJECTION WRAPPER
Freq: Once | INTRAMUSCULAR | Status: AC | PRN
Start: 2022-08-03 — End: 2022-08-03
  Administered 2022-08-03: 1 mg via INTRAVENOUS

## 2022-08-03 MED ORDER — SODIUM CHLORIDE 0.9 % INTRAVENOUS SOLUTION
INTRAVENOUS | Status: DC
Start: 2022-08-03 — End: 2022-08-04

## 2022-08-03 MED ORDER — MIDAZOLAM 5 MG/ML INJECTION WRAPPER
INTRAMUSCULAR | Status: AC
Start: 2022-08-03 — End: 2022-08-03
  Filled 2022-08-03: qty 1

## 2022-08-03 MED ORDER — FENTANYL (PF) 50 MCG/ML INJECTION WRAPPER
INJECTION | Freq: Once | INTRAMUSCULAR | Status: AC | PRN
Start: 2022-08-03 — End: 2022-08-03
  Administered 2022-08-03: 25 ug via INTRAVENOUS

## 2022-08-03 MED ORDER — FENTANYL (PF) 50 MCG/ML INJECTION SOLUTION
INTRAMUSCULAR | Status: AC
Start: 2022-08-03 — End: 2022-08-03
  Filled 2022-08-03: qty 2

## 2022-08-03 NOTE — Brief Op Note (Signed)
08/03/22      Radiologist: Dr. Lamar Blinks    Procedure: IR LUMBAR PUNCTURE    Description of Procedure Findings:      INFLAMMATORY NEUROPATHY        Estimated Blood Loss:0 ML        Specimen Collected:       8 ML CSF      Diagnosis: Inflammatory neuropathy (CMS HCC); AS ABOVE, DICTATED REPORT TO FOLLOW      Lamar Blinks, MD

## 2022-08-03 NOTE — Discharge Instructions (Signed)
Follow up with your primary care physician as needed.     Call the hospital or your primary doctor with any questions or concerns that you may have.     Leave your bandage in place for 24 hours. After this time, you may remove the bandage.     Resume home medications as previously prescribed.

## 2022-08-05 LAB — BORRELIA SPECIES DNA REAL TIME PCR, QUAL, MISC: LYME DIS (B.SPP) DNA,QL,B: NOT DETECTED

## 2022-08-08 LAB — CSF CULTURE WITH GRAM STAIN
CSF CULTURE: NO GROWTH
GRAM STAIN: NONE SEEN

## 2022-08-08 LAB — ANAEROBIC CULTURE: ANAEROBIC CULTURE: NO GROWTH

## 2022-08-10 LAB — LYME DISEASE ANTIBODY (IGG), IBL (CSF)

## 2022-08-16 ENCOUNTER — Other Ambulatory Visit: Payer: Self-pay

## 2022-08-16 ENCOUNTER — Emergency Department
Admission: EM | Admit: 2022-08-16 | Discharge: 2022-08-16 | Disposition: A | Discharge status: Home / Self Care | Payer: Medicare Other | Attending: Emergency Medicine | Admitting: Emergency Medicine

## 2022-08-16 DIAGNOSIS — M21371 Foot drop, right foot: Secondary | ICD-10-CM | POA: Insufficient documentation

## 2022-08-16 DIAGNOSIS — A6920 Lyme disease, unspecified: Secondary | ICD-10-CM | POA: Insufficient documentation

## 2022-08-16 DIAGNOSIS — R768 Other specified abnormal immunological findings in serum: Secondary | ICD-10-CM

## 2022-08-16 DIAGNOSIS — M21372 Foot drop, left foot: Secondary | ICD-10-CM | POA: Insufficient documentation

## 2022-08-16 DIAGNOSIS — Z720 Tobacco use: Secondary | ICD-10-CM | POA: Insufficient documentation

## 2022-08-16 LAB — AFB CULTURE WITH STAIN: AFB SMEAR: NEGATIVE

## 2022-08-16 LAB — COMPREHENSIVE METABOLIC PANEL, NON-FASTING
ALBUMIN/GLOBULIN RATIO: 1.1 (ref 0.8–1.4)
ALBUMIN: 4.1 g/dL (ref 3.5–5.7)
ALKALINE PHOSPHATASE: 71 U/L (ref 34–104)
ALT (SGPT): 18 U/L (ref 7–52)
ANION GAP: 9 mmol/L (ref 4–13)
AST (SGOT): 25 U/L (ref 13–39)
BILIRUBIN TOTAL: 0.3 mg/dL (ref 0.3–1.2)
BUN/CREA RATIO: 21 (ref 6–22)
BUN: 17 mg/dL (ref 7–25)
CALCIUM, CORRECTED: 9.7 mg/dL (ref 8.9–10.8)
CALCIUM: 9.8 mg/dL (ref 8.6–10.3)
CHLORIDE: 108 mmol/L — ABNORMAL HIGH (ref 98–107)
CO2 TOTAL: 23 mmol/L (ref 21–31)
CREATININE: 0.8 mg/dL (ref 0.60–1.30)
ESTIMATED GFR: 92 mL/min/{1.73_m2} (ref >59)
GLOBULIN: 3.6 (ref 2.9–5.4)
GLUCOSE: 156 mg/dL — ABNORMAL HIGH (ref 74–109)
OSMOLALITY, CALCULATED: 284 mOsm/kg (ref 270–290)
POTASSIUM: 3.6 mmol/L (ref 3.5–5.1)
PROTEIN TOTAL: 7.7 g/dL (ref 6.4–8.9)
SODIUM: 140 mmol/L (ref 136–145)

## 2022-08-16 LAB — CBC WITH DIFF
BASOPHIL #: 0 10*3/uL (ref 0.00–0.10)
BASOPHIL %: 0 % (ref 0–1)
EOSINOPHIL #: 0 10*3/uL (ref 0.00–0.50)
EOSINOPHIL %: 1 %
HCT: 40.8 % (ref 36.7–47.1)
HGB: 13.6 g/dL (ref 12.5–16.3)
LYMPHOCYTE #: 1.3 10*3/uL (ref 1.00–3.00)
LYMPHOCYTE %: 18 % (ref 16–44)
MCH: 30.4 pg (ref 23.8–33.4)
MCHC: 33.4 g/dL (ref 32.5–36.3)
MCV: 91.1 fL (ref 73.0–96.2)
MONOCYTE #: 0.5 10*3/uL (ref 0.30–1.00)
MONOCYTE %: 7 % (ref 5–13)
MPV: 7.8 fL (ref 7.4–11.4)
NEUTROPHIL #: 5.5 10*3/uL (ref 1.85–7.80)
NEUTROPHIL %: 74 % (ref 43–77)
PLATELETS: 256 10*3/uL (ref 140–440)
RBC: 4.48 10*6/uL (ref 4.06–5.63)
RDW: 15.3 % (ref 12.1–16.2)
WBC: 7.4 10*3/uL (ref 3.6–10.2)

## 2022-08-16 LAB — SEDIMENTATION RATE: ERYTHROCYTE SEDIMENTATION RATE (ESR): 64 mm/hr — ABNORMAL HIGH (ref <20)

## 2022-08-16 LAB — C-REACTIVE PROTEIN (CRP): C-REACTIVE PROTEIN (CRP): 3.3 mg/dL — ABNORMAL HIGH (ref 0.1–0.5)

## 2022-08-16 MED ORDER — CEFTRIAXONE 2 GRAM SOLUTION FOR INJECTION
INTRAMUSCULAR | Status: AC
Start: 2022-08-16 — End: 2022-08-16
  Filled 2022-08-16: qty 20

## 2022-08-16 MED ORDER — DOXYCYCLINE HYCLATE 100 MG CAPSULE
100.0000 mg | ORAL_CAPSULE | Freq: Two times a day (BID) | ORAL | 0 refills | Status: AC
Start: 2022-08-16 — End: 2022-09-13

## 2022-08-16 MED ORDER — SODIUM CHLORIDE 0.9 % INTRAVENOUS PIGGYBACK
INJECTION | INTRAVENOUS | Status: AC
Start: 2022-08-16 — End: 2022-08-16
  Filled 2022-08-16: qty 50

## 2022-08-16 MED ORDER — SODIUM CHLORIDE 0.9 % INTRAVENOUS PIGGYBACK
2.0000 g | INTRAVENOUS | Status: AC
Start: 2022-08-16 — End: 2022-08-16
  Administered 2022-08-16: 0 g via INTRAVENOUS
  Administered 2022-08-16: 2 g via INTRAVENOUS

## 2022-08-16 NOTE — Discharge Instructions (Addendum)
Doxycycline 100 mg twice daily for the next 28 days.      As needed follow up with Neurology as an outpatient for further evaluation.    Multiple labs were added today.  Need to be sure someone follows up on these labs.    Return emergency department if worse and as needed.

## 2022-08-16 NOTE — ED Provider Notes (Signed)
[facility]  Emergency Department  Attending Provider Note      CHIEF COMPLAINT  Chief Complaint   Patient presents with    Abnormal Lab Result     HISTORY OF PRESENT ILLNESS  Andrew Kennedy, date of birth 12-11-1945, is a 77 y.o. male who presented to the Emergency Department after he was told to come here by his neurologist.  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  He states he was referred to a local neurologist who performed nerve conduction tests in the office which were reduced.  He was then sent to interventional radiology for an LP.  The patient has multiple positive findings in his CSF.      PAST MEDICAL/SURGICAL/FAMILY/SOCIAL HISTORY  Past Medical History:   Diagnosis Date    BPH (benign prostatic hyperplasia)     Gout, unspecified     HTN (hypertension)     Hypothyroidism     Iron deficiency anemia, unspecified     Mixed hyperlipidemia     Vitamin B12 deficiency     Vitamin D deficiency        Past Surgical History:   Procedure Laterality Date    TONSILLECTOMY         Family Medical History:    None       Social History     Socioeconomic History    Marital status: Single   Tobacco Use    Smokeless tobacco: Current     Types: Snuff   Substance and Sexual Activity    Alcohol use: Not Currently    Drug use: Not Currently      ALLERGIES  No Known Allergies    PHYSICAL EXAM  VITAL SIGNS:  Filed Vitals:    08/16/22 1950 08/16/22 2332   BP: (!) 146/87 (!) 140/88   Pulse: (!) 113 98   Resp: 20 20   Temp: 36.2 C (97.1 F)    SpO2: 97% 98%     GENERAL: PATIENT IS ALERT AND ORIENTED TO PERSON, PLACE, AND TIME.  HEAD: NORMOCEPHALIC AND ATRAUMATIC.  EYES: PUPILS EQUALLY ROUND AND REACT TO LIGHT. EXTRAOCULAR MOVEMENTS INTACT.  EARS: GROSS HEARING INTACT. EXTERNAL EARS WITHIN NORMAL LIMITS.  NOSE: NO SEPTAL DEVIATION. NASAL PASSAGES CLEAR.  THROAT: MOIST ORAL MUCOSA. NO ERYTHEMA OR EXUDATE OF THE PHARYNX.  NECK:  SUPPLE. TRACHEA MIDLINE.  CARDIOVASCULAR: REGULAR RATE, AND RHYTHM. NO MURMUR.  LUNGS: CLEAR TO AUSCULTATION BILATERAL.  ABDOMEN: SOFT, NON-TENDER, NON-DISTENDED, AND BOWEL SOUNDS ARE PRESENT.  GENITOURINARY: DEFERRED.  RECTAL: DEFERRED.  EXTREMITIES: NO CYANOSIS, CLUBBING, OR EDEMA.  SKIN: WARM AND DRY.  NEUROLOGIC: CRANIAL NERVES II THROUGH XII ARE GROSSLY INTACT. MOVES ALL 4 EXTREMITIES. Bilateral foot drop.  PSYCHIATRIC: JUDGMENT AND INSIGHT ARE SEEMINGLY INTACT. MOOD AND AFFECT ARE APPROPRIATE FOR THE SITUATION.    DIAGNOSTICS  Labs:  Labs listed below were reviewed and interpreted by me.  Results for orders placed or performed during the hospital encounter of 08/16/22   COMPREHENSIVE METABOLIC PANEL, NON-FASTING   Result Value Ref Range    SODIUM 140 136 - 145 mmol/L    POTASSIUM 3.6 3.5 - 5.1 mmol/L    CHLORIDE 108 (H) 98 - 107 mmol/L    CO2 TOTAL 23 21 - 31 mmol/L    ANION GAP 9 4 - 13 mmol/L    BUN 17 7 - 25 mg/dL    CREATININE 0.80 0.60 - 1.30  mg/dL    BUN/CREA RATIO 21 6 - 22    ESTIMATED GFR 92 >59 mL/min/1.51m^2    ALBUMIN 4.1 3.5 - 5.7 g/dL    CALCIUM 9.8 8.6 - 86.7 mg/dL    GLUCOSE 619 (H) 74 - 109 mg/dL    ALKALINE PHOSPHATASE 71 34 - 104 U/L    ALT (SGPT) 18 7 - 52 U/L    AST (SGOT) 25 13 - 39 U/L    BILIRUBIN TOTAL 0.3 0.3 - 1.2 mg/dL    PROTEIN TOTAL 7.7 6.4 - 8.9 g/dL    ALBUMIN/GLOBULIN RATIO 1.1 0.8 - 1.4    OSMOLALITY, CALCULATED 284 270 - 290 mOsm/kg    CALCIUM, CORRECTED 9.7 8.9 - 10.8 mg/dL    GLOBULIN 3.6 2.9 - 5.4   CBC WITH DIFF   Result Value Ref Range    WBC 7.4 3.6 - 10.2 x10^3/uL    RBC 4.48 4.06 - 5.63 x10^6/uL    HGB 13.6 12.5 - 16.3 g/dL    HCT 50.9 32.6 - 71.2 %    MCV 91.1 73.0 - 96.2 fL    MCH 30.4 23.8 - 33.4 pg    MCHC 33.4 32.5 - 36.3 g/dL    RDW 45.8 09.9 - 83.3 %    PLATELETS 256 140 - 440 x10^3/uL    MPV 7.8 7.4 - 11.4 fL    NEUTROPHIL % 74 43 - 77 %    LYMPHOCYTE % 18 16 - 44 %    MONOCYTE % 7 5 - 13 %    EOSINOPHIL % 1 %    BASOPHIL % 0 0 - 1 %    NEUTROPHIL # 5.50 1.85  - 7.80 x10^3/uL    LYMPHOCYTE # 1.30 1.00 - 3.00 x10^3/uL    MONOCYTE # 0.50 0.30 - 1.00 x10^3/uL    EOSINOPHIL # 0.00 0.00 - 0.50 x10^3/uL    BASOPHIL # 0.00 0.00 - 0.10 x10^3/uL   C-REACTIVE PROTEIN (CRP)   Result Value Ref Range    C-REACTIVE PROTEIN (CRP) 3.3 (H) 0.1 - 0.5 mg/dL   SEDIMENTATION RATE   Result Value Ref Range    ERYTHROCYTE SEDIMENTATION RATE (ESR) 64 (H) <20 mm/hr     Radiology:       ED COURSE/MEDICAL DECISION MAKING  Medications Administered in the ED   cefTRIAXone (ROCEPHIN) 2 g in NS 50 mL IVPB minibag (0 g Intravenous Stopped 08/16/22 2042)          Medical Decision Making  The patient is into the ED by his neurologist after had multiple positive findings in CSF.  One of these findings was elevated Lyme IgG.  The patient also had a positive acid-fast stain in the CSF.  The patient does have bilateral foot drop which is developed over the past 4-5 years.  Currently the patient has no complaints other than the footdrop.  He has had no fever or other complaints recently.  This case was discussed with Dr. Casimiro Needle, infectious disease on-call at [facility].  He states this patient has multiple abnormal findings in his CSF which are all confusing and do not point to a single diagnosis.  He states this patient needs to have an outpatient neurology follow-up.  He does recommend starting on doxycycline 100 mg twice daily for the next 28 days.  He also recommended ordering serum Lyme testing including immuno blot and QuantiFERON gold.  These orders have been placed.  The patient will be discharged home with doxycycline as directed.  CONSULTS  Dr. Jabier Mutton    CLINICAL IMPRESSION  Clinical Impression   Lyme disease (Primary)     DISPOSITION  Discharged       DISCHARGE MEDICATIONS  Discharge Medication List as of 08/16/2022 11:21 PM        START taking these medications    Details   doxycycline hyclate (VIBRAMYCIN) 100 mg Oral Capsule Take 1 Capsule (100 mg total) by mouth Twice daily for 28 days,  Disp-56 Capsule, R-0, E-Rx             Jules Husbands D.O.   08/16/2022, 23:12   [facility]  Department of Emergency Medicine  [facility]    Contents of the document, in whole or in part, are completed utilizing M*Modal dictation technology, please forgive any typographical errors that may exist.   -----

## 2022-08-16 NOTE — ED Attending Handoff Note (Signed)
[facility]  Emergency Department  Provider in Triage Note    Name: Andrew Kennedy  Age: 77 y.o.  Gender: male     Subjective:   Andrew Kennedy is a 77 y.o. male who presents with complaint of Abnormal Lab Result  .    Objective:   Filed Vitals:    08/16/22 1950   BP: (!) 146/87   Pulse: (!) 113   Resp: 20   Temp: 36.2 C (97.1 F)   SpO2: 97%      Vitals are also documented in the EMR.  Focused Physical Exam shows ambulatory 77 y.o. male who walks with a cane. He has foot drop.    Assessment:  A medical screening exam was completed.  This patient is a 77 y.o. male with Abnormal Lab Result  .    Plan:  Please see initial orders and work-up in the EMR.  This is to be continued with full evaluation in the main Emergency Department.     No current facility-administered medications for this encounter.     Results for orders placed or performed during the hospital encounter of 08/16/22 (from the past 24 hour(s))   COMPREHENSIVE METABOLIC PANEL, NON-FASTING   Result Value Ref Range    SODIUM 140 136 - 145 mmol/L    POTASSIUM 3.6 3.5 - 5.1 mmol/L    CHLORIDE 108 (H) 98 - 107 mmol/L    CO2 TOTAL 23 21 - 31 mmol/L    ANION GAP 9 4 - 13 mmol/L    BUN 17 7 - 25 mg/dL    CREATININE 0.80 0.60 - 1.30 mg/dL    BUN/CREA RATIO 21 6 - 22    ESTIMATED GFR 92 >59 mL/min/1.62m^2    ALBUMIN 4.1 3.5 - 5.7 g/dL    CALCIUM 9.8 8.6 - 10.3 mg/dL    GLUCOSE 156 (H) 74 - 109 mg/dL    ALKALINE PHOSPHATASE 71 34 - 104 U/L    ALT (SGPT) 18 7 - 52 U/L    AST (SGOT) 25 13 - 39 U/L    BILIRUBIN TOTAL 0.3 0.3 - 1.2 mg/dL    PROTEIN TOTAL 7.7 6.4 - 8.9 g/dL    ALBUMIN/GLOBULIN RATIO 1.1 0.8 - 1.4    OSMOLALITY, CALCULATED 284 270 - 290 mOsm/kg    CALCIUM, CORRECTED 9.7 8.9 - 10.8 mg/dL    GLOBULIN 3.6 2.9 - 5.4    Narrative    Estimated Glomerular Filtration Rate (eGFR) is calculated using the CKD-EPI (2021) equation, intended for patients 39 years of age and older. If gender is not documented or "unknown", there will  be no eGFR calculation.     CBC/DIFF    Narrative    The following orders were created for panel order CBC/DIFF.  Procedure                               Abnormality         Status                     ---------                               -----------         ------  CBC WITH PJSR[[id number]]                Normal              Final result                 Please view results for these tests on the individual orders.   CBC WITH DIFF   Result Value Ref Range    WBC 7.4 3.6 - 10.2 x10^3/uL    RBC 4.48 4.06 - 5.63 x10^6/uL    HGB 13.6 12.5 - 16.3 g/dL    HCT 40.8 36.7 - 47.1 %    MCV 91.1 73.0 - 96.2 fL    MCH 30.4 23.8 - 33.4 pg    MCHC 33.4 32.5 - 36.3 g/dL    RDW 15.3 12.1 - 16.2 %    PLATELETS 256 140 - 440 x10^3/uL    MPV 7.8 7.4 - 11.4 fL    NEUTROPHIL % 74 43 - 77 %    LYMPHOCYTE % 18 16 - 44 %    MONOCYTE % 7 5 - 13 %    EOSINOPHIL % 1 %    BASOPHIL % 0 0 - 1 %    NEUTROPHIL # 5.50 1.85 - 7.80 x10^3/uL    LYMPHOCYTE # 1.30 1.00 - 3.00 x10^3/uL    MONOCYTE # 0.50 0.30 - 1.00 x10^3/uL    EOSINOPHIL # 0.00 0.00 - 0.50 x10^3/uL    BASOPHIL # 0.00 0.00 - 0.10 x10^3/uL   C-REACTIVE PROTEIN (CRP)   Result Value Ref Range    C-REACTIVE PROTEIN (CRP) 3.3 (H) 0.1 - 0.5 mg/dL   SEDIMENTATION RATE   Result Value Ref Range    ERYTHROCYTE SEDIMENTATION RATE (ESR) 64 (H) <20 mm/hr    Narrative    [id number] ON [id number] IN 0000/6          /R. Baldo Daub, MD, Wilber Oliphant   08/16/2022, 19:50   Department of Emergency Medicine  [facility]

## 2022-08-16 NOTE — ED Triage Notes (Signed)
Had spinal tap which apparently showed lyme's disease.  Sent here for treatment.

## 2022-08-18 LAB — LYME ANTIBODY PANEL WITH REFLEX: LYME ANTIBODY TOTAL (Screen): POSITIVE — AB

## 2022-08-21 LAB — LYME DISEASE ANTIBODY, IGG: LYME ANTIBODY IGG (Confirmation): POSITIVE — AB

## 2022-08-21 LAB — ADULT ROUTINE BLOOD CULTURE, SET OF 2 BOTTLES (BACTERIA AND YEAST)
BLOOD CULTURE, ROUTINE: NO GROWTH
BLOOD CULTURE, ROUTINE: NO GROWTH

## 2022-08-21 LAB — LYME DISEASE ANTIBODY, IGM: LYME ANTIBODY IGM (Confirmation): NEGATIVE

## 2022-08-21 NOTE — Result Encounter Note (Signed)
Managed by Dr. Sabra Heck at time of visit. No intervention needed at this time. He is Doxycycline and is following up with neuro in [place].

## 2022-08-31 LAB — FUNGUS CULTURE: FUNGAL CULTURE: NO GROWTH

## 2022-08-31 NOTE — ED Nurses Note (Signed)
PT CONTACTED BY PHONE AND INSTRUCTED TO COME BACK IN TO HAVE QUANTIFERON GOLD REDRAWN.

## 2022-09-01 ENCOUNTER — Other Ambulatory Visit: Payer: Self-pay

## 2022-09-01 ENCOUNTER — Emergency Department
Admission: EM | Admit: 2022-09-01 | Discharge: 2022-09-01 | Disposition: A | Discharge status: Home / Self Care | Payer: Medicare Other | Attending: Emergency Medicine | Admitting: Emergency Medicine

## 2022-09-01 ENCOUNTER — Encounter (HOSPITAL_COMMUNITY): Payer: Self-pay

## 2022-09-01 DIAGNOSIS — R799 Abnormal finding of blood chemistry, unspecified: Secondary | ICD-10-CM

## 2022-09-01 DIAGNOSIS — Z720 Tobacco use: Secondary | ICD-10-CM | POA: Insufficient documentation

## 2022-09-01 DIAGNOSIS — R7989 Other specified abnormal findings of blood chemistry: Secondary | ICD-10-CM | POA: Insufficient documentation

## 2022-09-01 DIAGNOSIS — R899 Unspecified abnormal finding in specimens from other organs, systems and tissues: Secondary | ICD-10-CM

## 2022-09-01 LAB — LIGHT GREEN TOP TUBE

## 2022-09-01 LAB — GOLD TOP TUBE

## 2022-09-01 LAB — BLUE TOP TUBE

## 2022-09-01 LAB — LAVENDER TOP TUBE

## 2022-09-01 LAB — GRAY TOP TUBE

## 2022-09-01 NOTE — ED Nurses Note (Signed)
Patient discharged home.  AVS reviewed with patient.  A written copy of the AVS and discharge instructions was given to the patient.  Questions sufficiently answered as needed.  Patient encouraged to follow up with PCP as indicated.  In the event of an emergency, patient instructed to call 911 or go to the nearest emergency room.

## 2022-09-01 NOTE — ED Nurses Note (Signed)
Pt to EDM room 25 via ambulation with c/o generalized weakness and abnormal labs. Pt reports "I might have Lyme's disease and they think I could have TB from 15 to 20 years ago. I don't know." Pt is placed on bedside monitoring. Call bell within reach.

## 2022-09-01 NOTE — ED Provider Notes (Signed)
[facility]  Emergency Department  Attending Provider Note      CHIEF COMPLAINT  Chief Complaint   Patient presents with    Abnormal Lab Result     HISTORY OF PRESENT ILLNESS  Andrew Kennedy, date of birth [birth year], is a 77 y.o. male who presented to the Emergency Department.    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  No current symptoms.  Otherwise doing well.    PAST MEDICAL/SURGICAL/FAMILY/SOCIAL HISTORY  Past Medical History:   Diagnosis Date    BPH (benign prostatic hyperplasia)     Gout, unspecified     HTN (hypertension)     Hypothyroidism     Iron deficiency anemia, unspecified     Mixed hyperlipidemia     Vitamin B12 deficiency     Vitamin D deficiency        Past Surgical History:   Procedure Laterality Date    TONSILLECTOMY         Family Medical History:    None       Social History     Socioeconomic History    Marital status: Single   Tobacco Use    Smoking status: Never    Smokeless tobacco: Current     Types: Snuff   Substance and Sexual Activity    Alcohol use: Not Currently    Drug use: Not Currently      ALLERGIES  No Known Allergies    PHYSICAL EXAM  VITAL SIGNS:  Filed Vitals:    09/01/22 0952   BP: 116/80   Pulse: 94   Resp: 20   Temp: 36.3 C (97.4 F)   SpO2: 96%     GENERAL: PATIENT IS ALERT AND ORIENTED TO PERSON, PLACE, AND TIME.  HEAD: NORMOCEPHALIC AND ATRAUMATIC.  EYES: PUPILS EQUALLY ROUND AND REACT TO LIGHT. EXTRAOCULAR MOVEMENTS INTACT.  EARS: GROSS HEARING INTACT. EXTERNAL EARS WITHIN NORMAL LIMITS.  NOSE: NO SEPTAL DEVIATION. NASAL PASSAGES CLEAR.  THROAT: MOIST ORAL MUCOSA.   NECK: SUPPLE. TRACHEA MIDLINE.  HEART: REGULAR, RATE, AND RHYTHM.  LUNGS: CLEAR TO AUSCULTATION BILATERAL.  ABDOMEN: SOFT, NON-TENDER, NON-DISTENDED, AND BOWEL SOUNDS ARE PRESENT.  GENITOURINARY: DEFERRED.  RECTAL: DEFERRED.  EXTREMITIES: NO CYANOSIS, CLUBBING, OR EDEMA.  SKIN: WARM AND DRY.  MUSCULOSKELETAL: DEFERRED.  NEUROLOGIC: CRANIAL  NERVES II THROUGH XII ARE GROSSLY INTACT. SENSATION TO LIGHT TOUCH IS INTACT.  PSYCHIATRIC: JUDGMENT AND INSIGHT ARE SEEMINGLY INTACT. MOOD AND AFFECT ARE APPROPRIATE FOR THE SITUATION.      DIAGNOSTICS  Labs:  Labs listed below were reviewed and interpreted by me.  No results found for any visits on 09/01/22.  Radiology:       ED COURSE/MEDICAL DECISION MAKING          Medical Decision Making  Problems Addressed:  Abnormal laboratory test: acute illness or injury    Amount and/or Complexity of Data Reviewed  Labs: ordered.    Risk  Diagnosis or treatment significantly limited by social determinants of health.      CLINICAL IMPRESSION  Clinical Impression   Abnormal laboratory test (Primary)     DISPOSITION  Discharged       DISCHARGE MEDICATIONS  Current Discharge Medication List          /Shambhavi Salley A. Rosana Hoes, DO, MBA   09/01/2022, 10:04   [facility]  Department of Emergency Medicine  [facility]    This note was partially generated using [employer]  system, and there may be some incorrect words, spellings, and punctuation that were not noted in checking the note before saving.

## 2022-09-01 NOTE — ED Triage Notes (Signed)
Here for recheck labs

## 2022-09-01 NOTE — Discharge Instructions (Signed)
THE PATIENT IS TO FOLLOW UP WITH THE PRIMARY CARE PROVIDER AS SOON AS POSSIBLE BUT NO LATER THAN 3 DAYS FROM LEAVING THE EMERGENCY DEPARTMENT.  IF NO PRIMARY CARE PROVIDER EXISTS, THEN THE PATIENT IS INSTRUCTED TO ESTABLISH CARE WITH A PRIMARY CARE PROVIDER AS SOON AS POSSIBLE BUT NO LATER THAN 3 DAYS FROM LEAVING THE EMERGENCY DEPARTMENT.  FOLLOW-UP WITH ANY SPECIALIST PROVIDER AS INDICATED AS SOON AS POSSIBLE BUT NO LATER THAN 3 DAYS, IF APPLICABLE.  NOTIFY THE PRIMARY CARE PROVIDER THAT YOU WERE IN THE EMERGENCY DEPARTMENT WITHIN 24 HOURS OF DISCHARGE TO FOLLOW-UP ON YOUR RESULTS AND/OR TREATMENTS.  RETURN TO THE EMERGENCY DEPARTMENT IMMEDIATELY IF NEEDED, NO BETTER, WORSE, NEW SYMPTOMS ARISE, OR YOU CANNOT FOLLOW-UP WITH YOUR PRIMARY CARE PROVIDER AND/OR APPLICABLE SPECIALIST IN THE PRESCRIBED TIMEFRAME.

## 2022-09-05 LAB — QUANTIFERON
QFT MITOGEN: 6.91 IU/mL
QFT NIL: 0.0766 IU/mL
QFT QUALITATIVE: NEGATIVE
QFT TB1: 0.11 IU/mL
QFT TB2: 0.11 IU/mL

## 2022-09-05 LAB — QUANTIFERON TB1: QFT TB1: 0.11 IU/mL

## 2022-09-05 LAB — QUANTIFERON TB MITOGEN: QFT MITOGEN: 6.91 IU/mL

## 2022-09-05 LAB — QUANTIFERON TB2: QFT TB2: 0.11 IU/mL

## 2022-09-05 LAB — LYME DISEASE ANTIBODY, IGG: LYME ANTIBODY IGG (Confirmation): POSITIVE — AB

## 2022-09-05 LAB — LYME ANTIBODY PANEL WITH REFLEX: LYME ANTIBODY TOTAL (Screen): POSITIVE — AB

## 2022-09-05 LAB — LYME DISEASE ANTIBODY, IGM: LYME ANTIBODY IGM (Confirmation): NEGATIVE

## 2022-09-05 LAB — QUANTIFERON TB NIL: QFT NIL: 0.0766 IU/mL

## 2022-09-07 NOTE — Result Encounter Note (Signed)
To be faxed to Dr Bosie Helper office

## 2022-09-20 ENCOUNTER — Telehealth (HOSPITAL_COMMUNITY): Payer: Self-pay | Admitting: Emergency Medicine

## 2022-09-23 ENCOUNTER — Ambulatory Visit (INDEPENDENT_AMBULATORY_CARE_PROVIDER_SITE_OTHER): Payer: Self-pay

## 2022-09-23 NOTE — Care Management Notes (Signed)
Received request from ED Doctor, Dr. Sabra Heck, that pt needs follow-up with Primary care because he needs referrals d/t his medical condition.  Per Dr. Sabra Heck, his current neurologist indicated that he wouldn't f/u on the results.  I obtained Andrew Kennedy an appointment at [facility] Primary care for 09/23/2022 at 10am. I called pt to inform him of the appointment, he stated he did not need it because he f/u with "his neurologist" on 09/28/2022. I explained to Andrew Kennedy, that it was my understanding his neurologist would not follow-up on the result that he has gotten from the ED.  Pt stated that he contacted his neurologist and that he was going to follow up and that he sees him next week.  I cancel the [facility] appointment.  ED provider was notified via secure chat.

## 2022-10-28 ENCOUNTER — Other Ambulatory Visit: Payer: Self-pay

## 2022-10-28 ENCOUNTER — Other Ambulatory Visit: Payer: Medicare Other

## 2022-10-28 DIAGNOSIS — G609 Hereditary and idiopathic neuropathy, unspecified: Secondary | ICD-10-CM | POA: Insufficient documentation

## 2022-12-30 LAB — QUEST MISC ORDER - AMBIENT

## 2023-03-15 IMAGING — DX XRAY BILATERAL SHOULDERS
2 series · 5 of 5 positions shown · non-contrast
Comparison: none

﻿EXAM:  71212   XRAY BILATERAL SHOULDERS
INDICATION: Bilateral shoulder pain.

[Series 1: apobl · 0.14mm/px · 3 of 3 slices shown (1 of 2)]
[im 1/3]
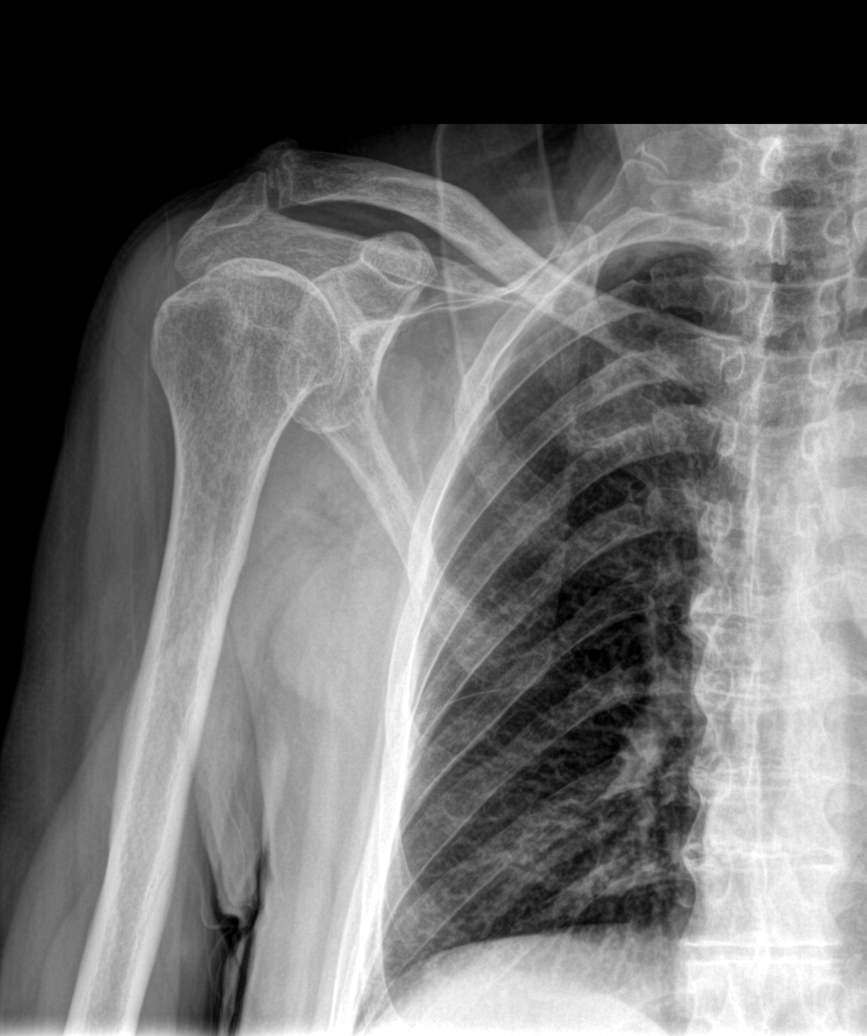
[im 2/3]
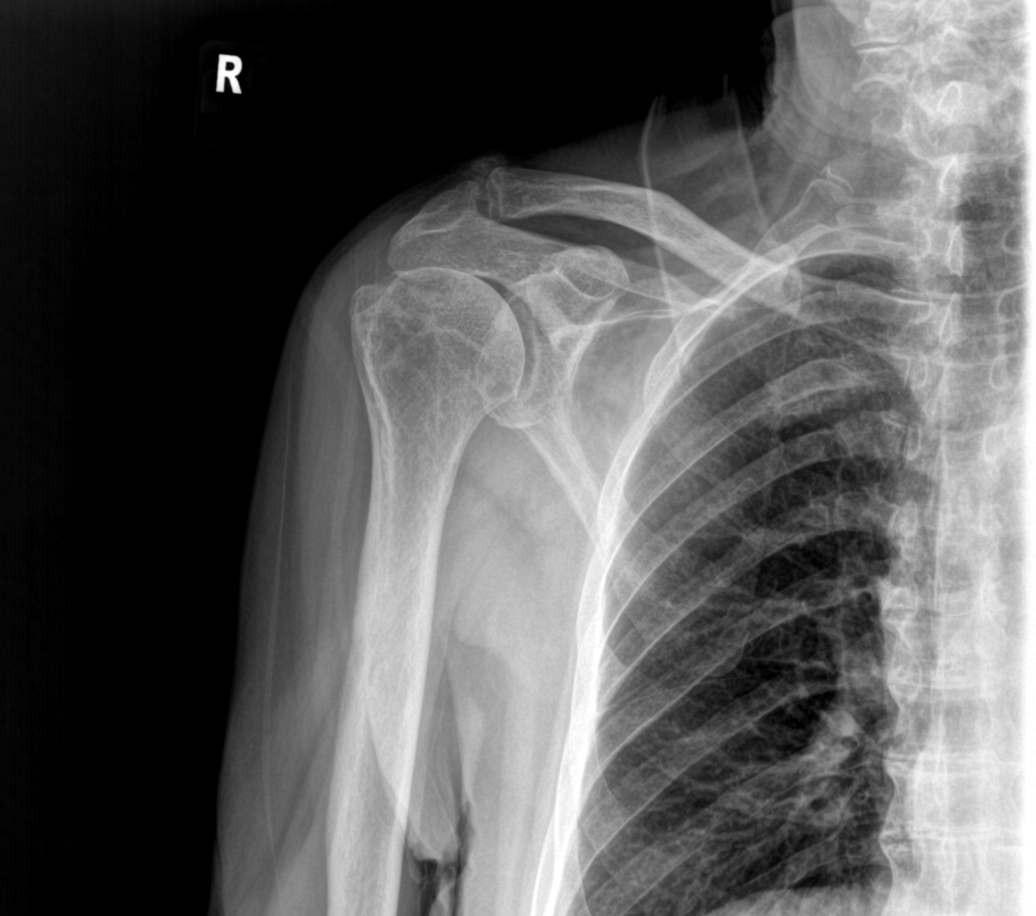
[im 3/3]
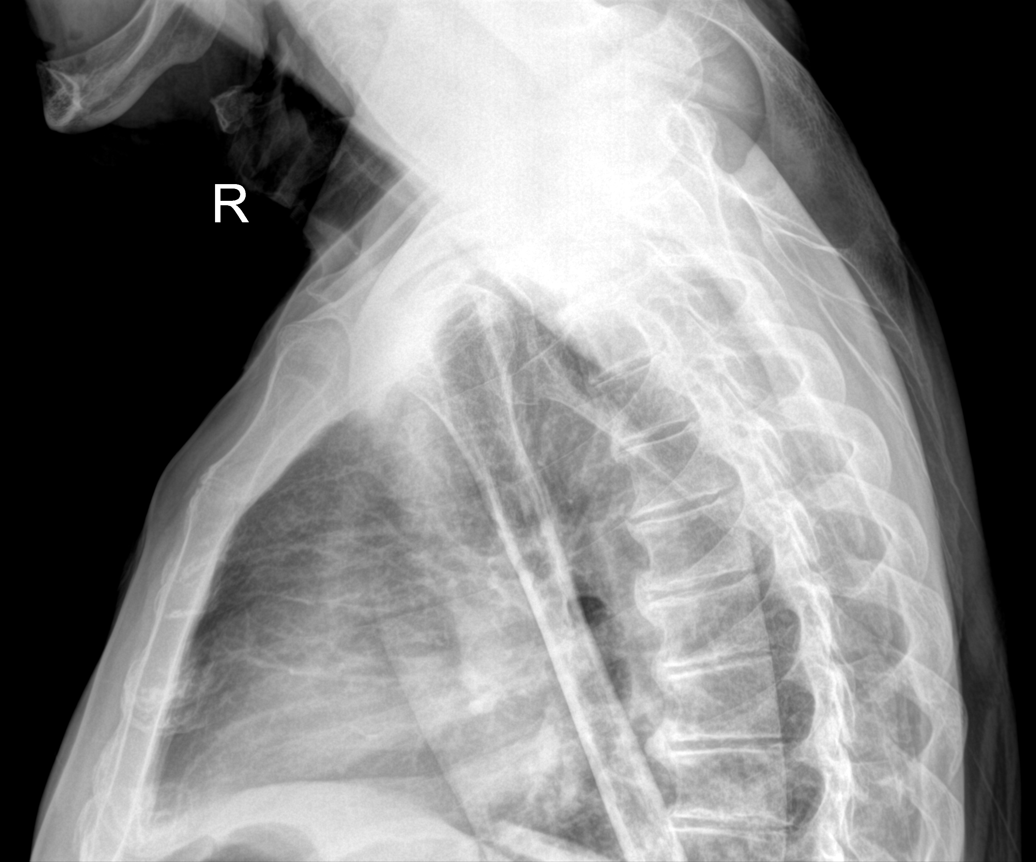

[Series 2: apobl · 0.14mm/px · 2 of 2 slices shown (2 of 2)]
[im 1/2]
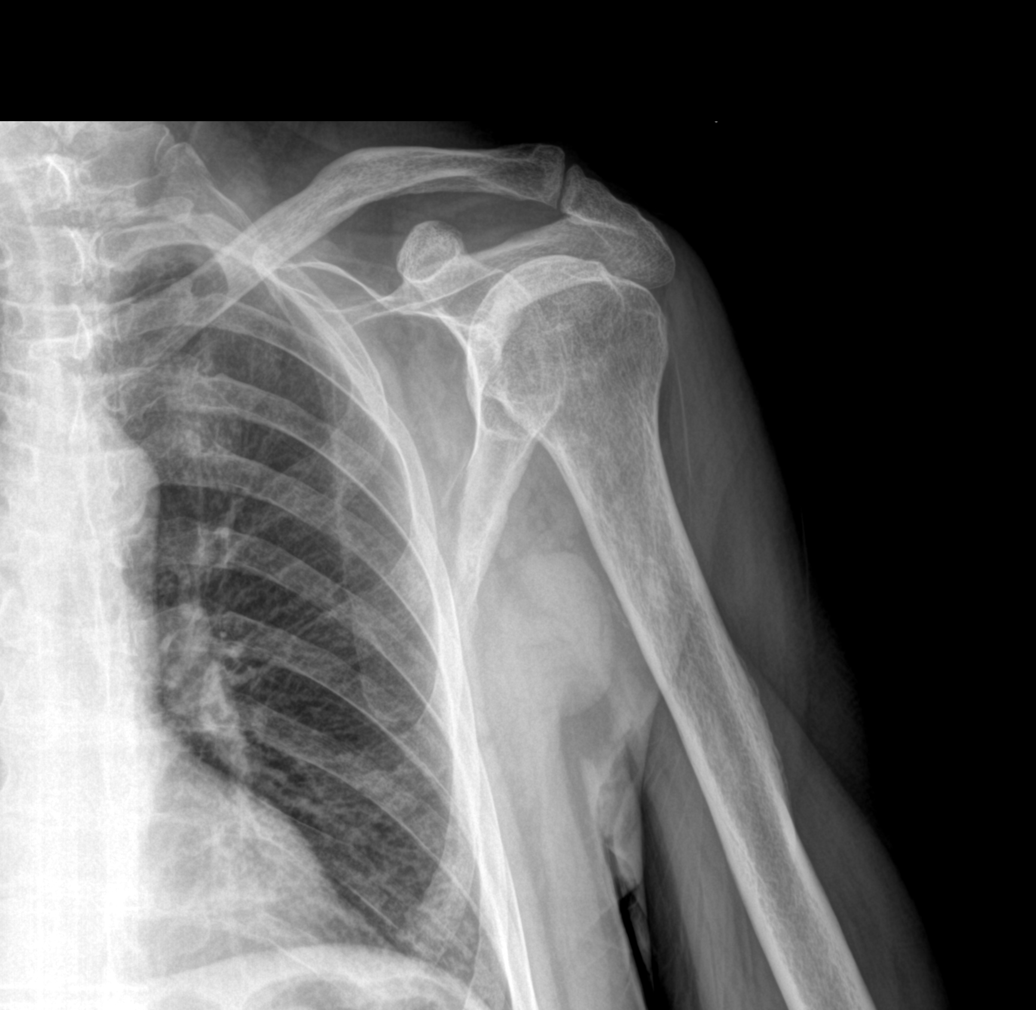
[im 2/2]
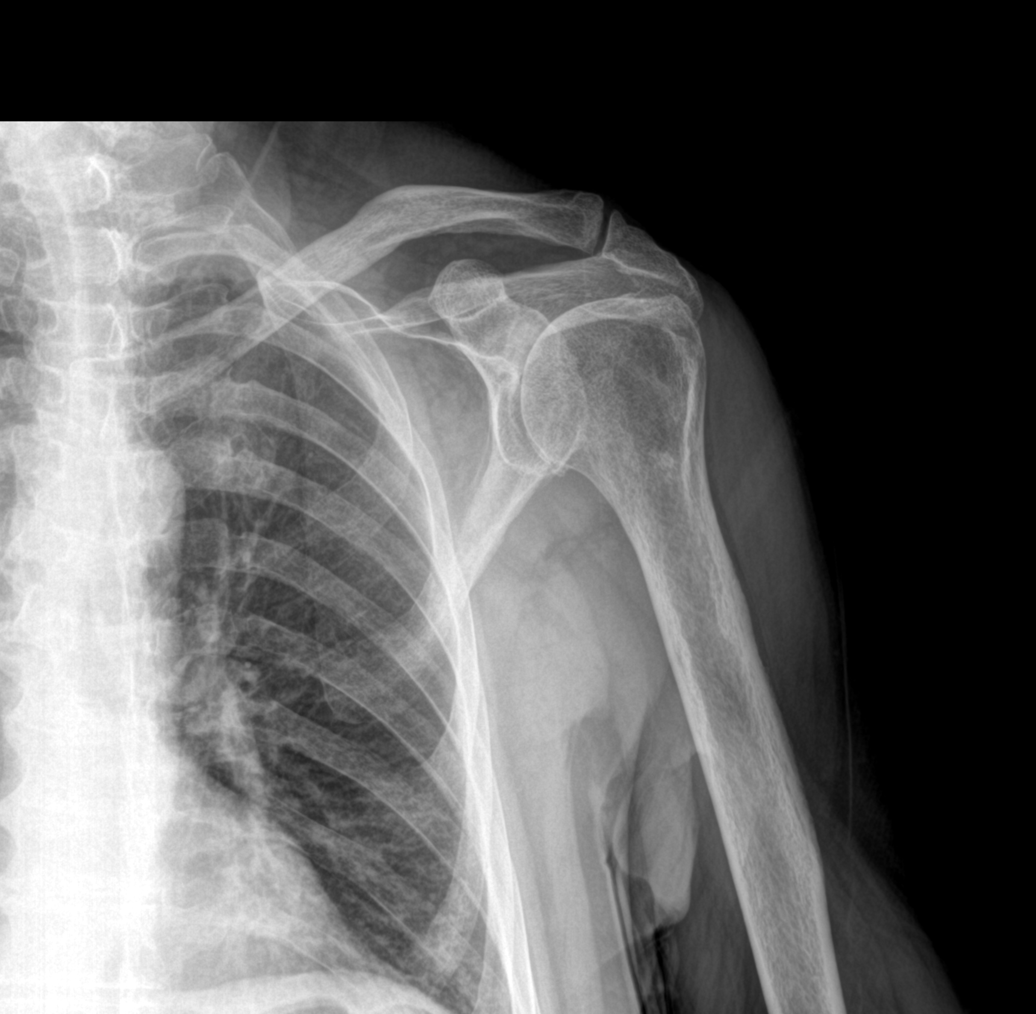

[5 of 5 positions shown; findings below may reference images not displayed]

FINDINGS: Examination of the right shoulder reveals a moderate amount of heterotopic ossification in the region of the right acromioclavicular joint.  This is compatible with an old acromioclavicular injury.  

There are mild degenerative changes involving the left acromioclavicular joint and both glenohumeral joints.  There is no acute fracture, dislocation, or separation.
IMPRESSION: 1. Right acromioclavicular heterotopic ossification compatible with an old injury.  

2. Mild osteoarthritis.
